# Patient Record
Sex: Female | Born: 1950 | Race: White | Hispanic: No | Marital: Married | Smoking: Never smoker
Health system: Southern US, Community
[De-identification: ages and names within clinical notes are randomized; demographics above are authoritative.]

## PROBLEM LIST (undated history)

## (undated) DIAGNOSIS — M069 Rheumatoid arthritis, unspecified: Secondary | ICD-10-CM

## (undated) HISTORY — PX: CATARACT EXTRACTION: SUR2

## (undated) HISTORY — DX: Rheumatoid arthritis, unspecified: M06.9

## (undated) HISTORY — PX: ESOPHAGOGASTRODUODENOSCOPY: SHX1529

## (undated) HISTORY — PX: COLONOSCOPY: SHX174

---

## 2013-01-10 ENCOUNTER — Other Ambulatory Visit (HOSPITAL_COMMUNITY): Payer: Self-pay | Admitting: Pulmonary Disease

## 2013-01-10 ENCOUNTER — Ambulatory Visit (HOSPITAL_COMMUNITY)
Admission: RE | Admit: 2013-01-10 | Discharge: 2013-01-10 | Disposition: A | Discharge status: Home / Self Care | Payer: BC Managed Care – PPO | Source: Ambulatory Visit | Attending: Pulmonary Disease | Admitting: Pulmonary Disease

## 2013-01-10 DIAGNOSIS — J45909 Unspecified asthma, uncomplicated: Secondary | ICD-10-CM | POA: Insufficient documentation

## 2013-01-10 DIAGNOSIS — R059 Cough, unspecified: Secondary | ICD-10-CM | POA: Insufficient documentation

## 2013-01-10 DIAGNOSIS — J4530 Mild persistent asthma, uncomplicated: Secondary | ICD-10-CM

## 2013-01-10 DIAGNOSIS — R05 Cough: Secondary | ICD-10-CM | POA: Insufficient documentation

## 2013-01-10 DIAGNOSIS — R0602 Shortness of breath: Secondary | ICD-10-CM | POA: Insufficient documentation

## 2013-11-14 ENCOUNTER — Other Ambulatory Visit (HOSPITAL_COMMUNITY): Payer: Self-pay | Admitting: Urology

## 2013-11-14 DIAGNOSIS — N390 Urinary tract infection, site not specified: Secondary | ICD-10-CM

## 2013-11-15 ENCOUNTER — Other Ambulatory Visit (HOSPITAL_COMMUNITY): Payer: Self-pay | Admitting: Urology

## 2013-11-15 DIAGNOSIS — N390 Urinary tract infection, site not specified: Secondary | ICD-10-CM

## 2013-11-16 ENCOUNTER — Ambulatory Visit (HOSPITAL_COMMUNITY)
Admission: RE | Admit: 2013-11-16 | Discharge: 2013-11-16 | Disposition: A | Discharge status: Home / Self Care | Payer: BC Managed Care – PPO | Source: Ambulatory Visit | Attending: Urology | Admitting: Urology

## 2013-11-16 ENCOUNTER — Ambulatory Visit (HOSPITAL_COMMUNITY): Payer: BC Managed Care – PPO

## 2013-11-16 DIAGNOSIS — R339 Retention of urine, unspecified: Secondary | ICD-10-CM | POA: Insufficient documentation

## 2013-11-16 DIAGNOSIS — N390 Urinary tract infection, site not specified: Secondary | ICD-10-CM | POA: Insufficient documentation

## 2015-09-17 IMAGING — US US RENAL
1 series · 14 of 25 positions shown · non-contrast
Comparison: None.

CLINICAL DATA: Recurrent urinary tract infections

EXAM:
RENAL/URINARY TRACT ULTRASOUND COMPLETE

[Series 1: us renal · 0.26mm/px · 14 of 44 slices shown]
[im 1/44]
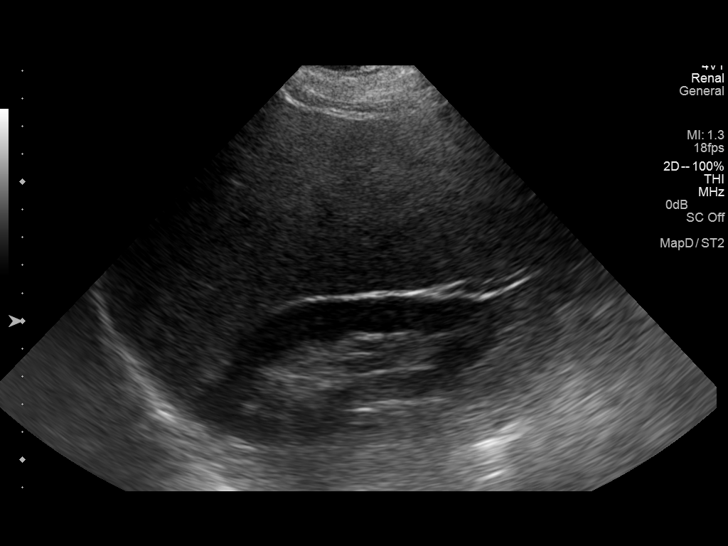
[im 4/44]
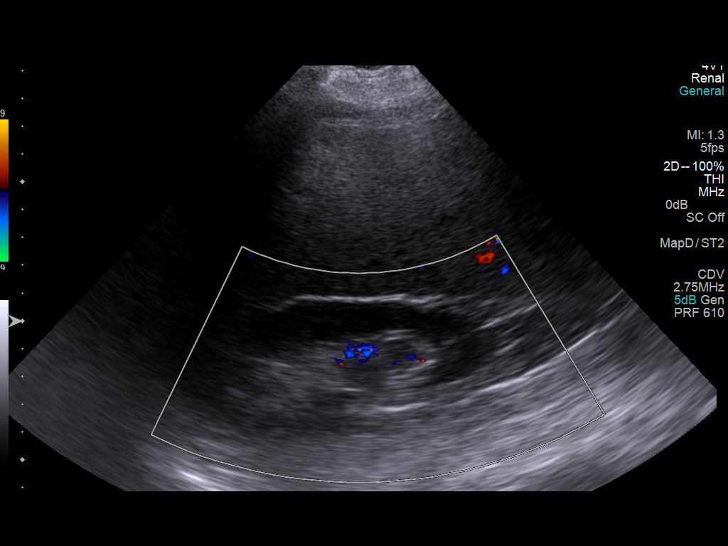
[im 8/44]
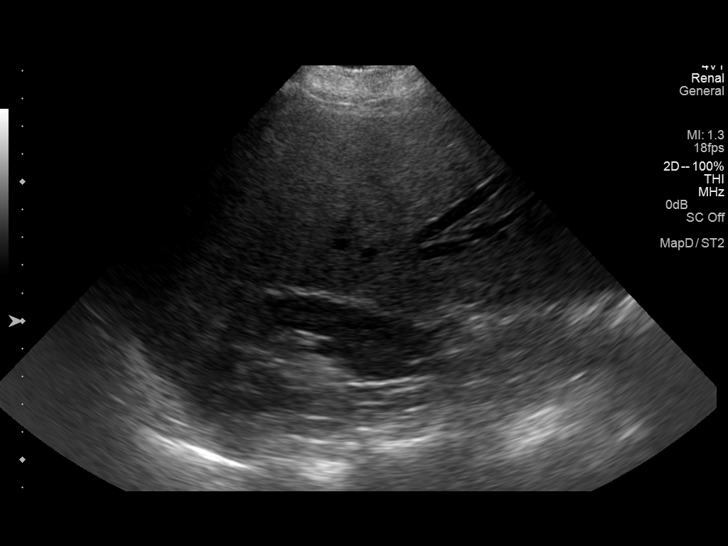
[im 11/44]
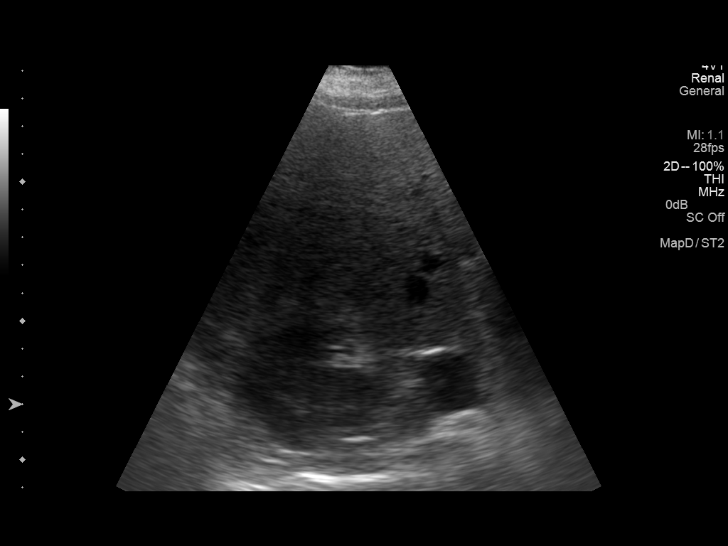
[im 15/44]
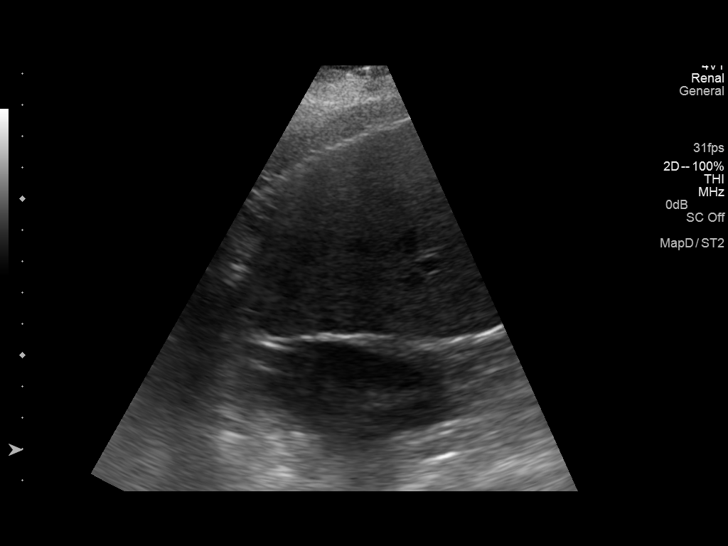
[im 17/44]
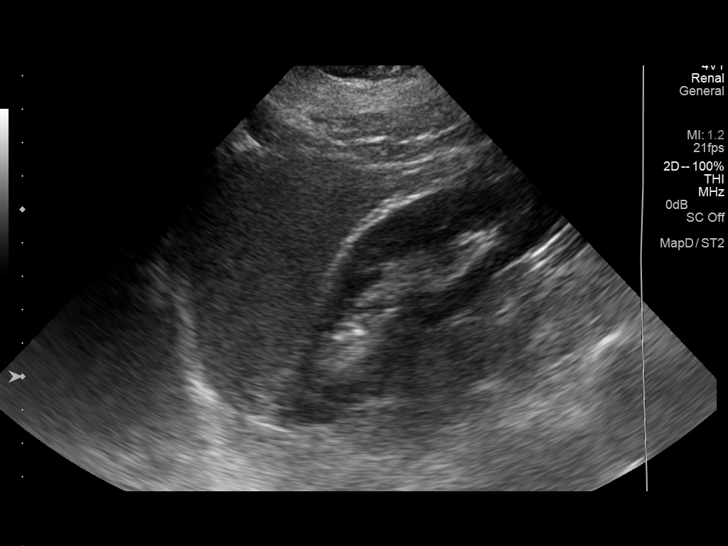
[im 20/44]
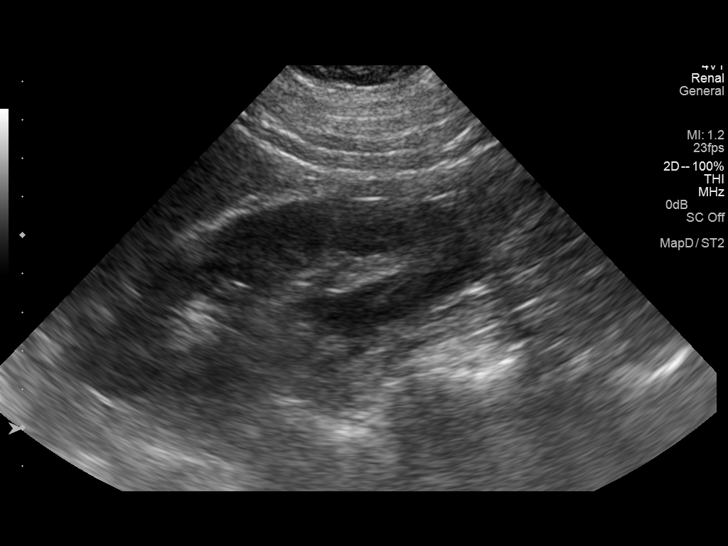
[im 24/44]
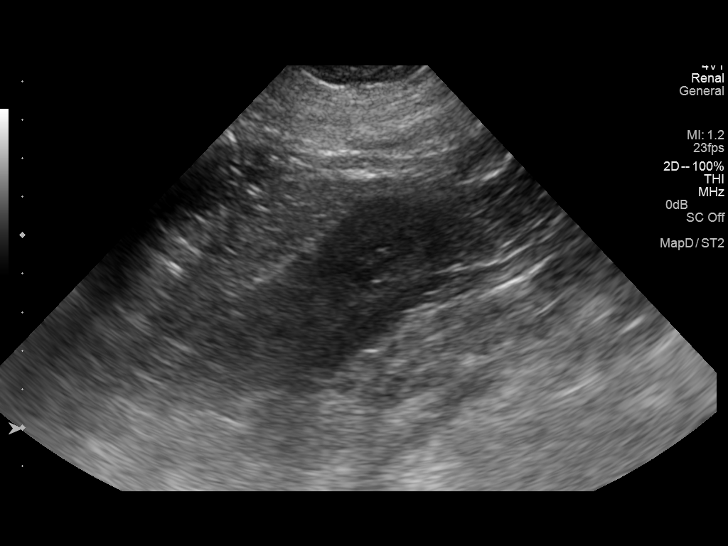
[im 27/44]
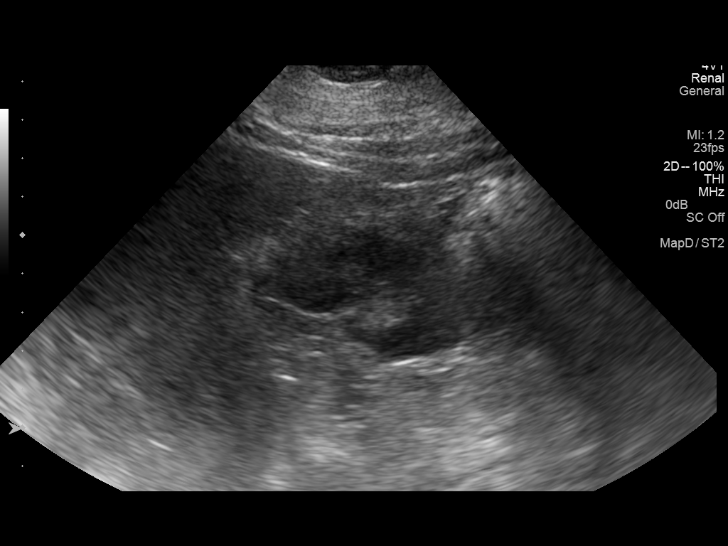
[im 29/44]
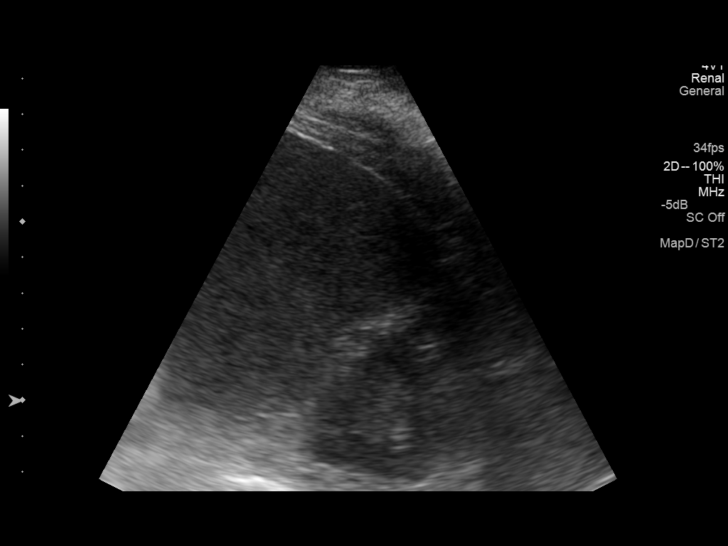
[im 33/44]
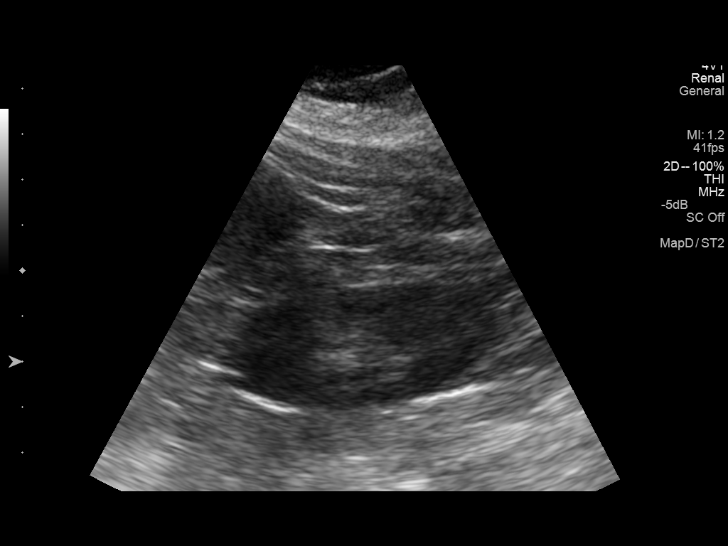
[im 36/44]
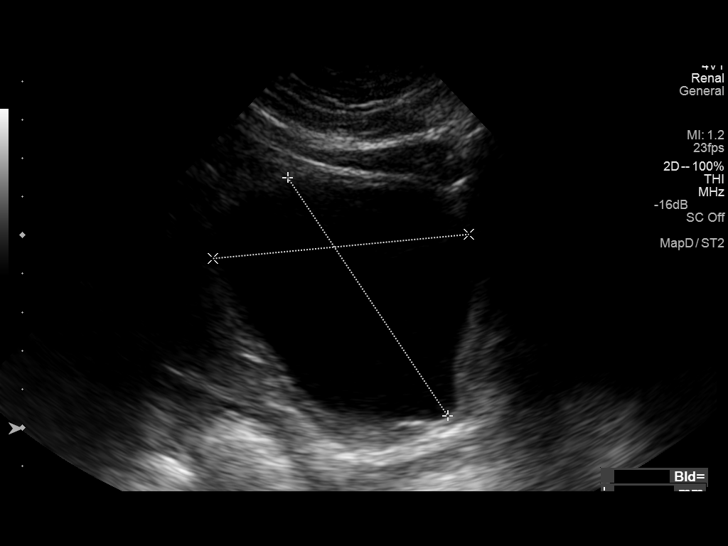
[im 40/44]
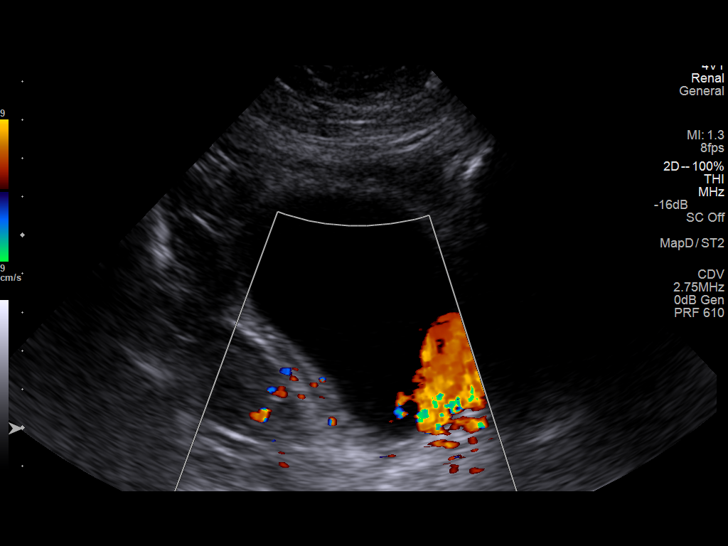
[im 44/44]
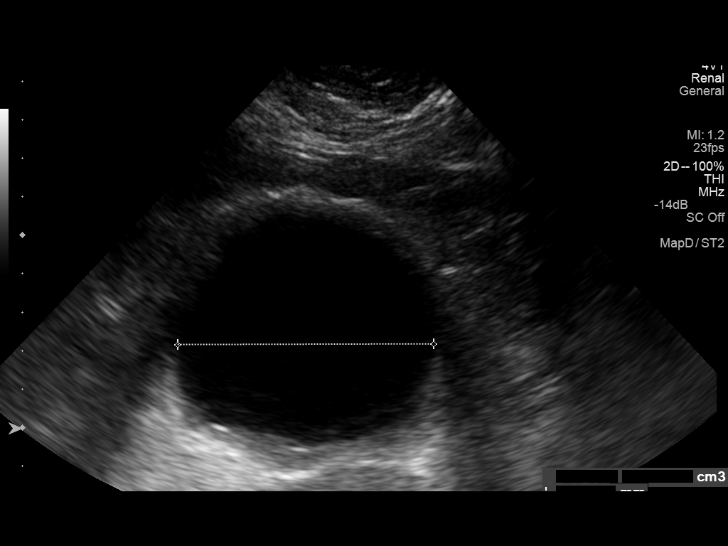

[14 of 25 positions shown; findings below may reference images not displayed]

FINDINGS: Right Kidney:

Length: 11.4 cm. Echogenicity within normal limits. No mass or
hydronephrosis visualized.

Left Kidney:

Length: 10.9 cm. Echogenicity within normal limits. No mass or
hydronephrosis visualized.

Bladder:

Bilateral ureteral jets noted. Normal appearance of the bladder. Pre
void bladder volume is equal to 270 cc. The postvoid bladder volume
is equal to 106 cc.
IMPRESSION: 1. Normal appearance of the kidneys.
2. Incomplete voiding with significant postvoid residual

## 2019-08-29 DIAGNOSIS — M3500 Sicca syndrome, unspecified: Secondary | ICD-10-CM | POA: Diagnosis not present

## 2019-08-29 DIAGNOSIS — M059 Rheumatoid arthritis with rheumatoid factor, unspecified: Secondary | ICD-10-CM | POA: Diagnosis not present

## 2019-08-29 DIAGNOSIS — Z79899 Other long term (current) drug therapy: Secondary | ICD-10-CM | POA: Diagnosis not present

## 2019-09-29 DIAGNOSIS — R7301 Impaired fasting glucose: Secondary | ICD-10-CM | POA: Diagnosis not present

## 2019-09-29 DIAGNOSIS — I10 Essential (primary) hypertension: Secondary | ICD-10-CM | POA: Diagnosis not present

## 2019-09-29 DIAGNOSIS — E559 Vitamin D deficiency, unspecified: Secondary | ICD-10-CM | POA: Diagnosis not present

## 2019-09-29 DIAGNOSIS — R944 Abnormal results of kidney function studies: Secondary | ICD-10-CM | POA: Diagnosis not present

## 2019-10-04 DIAGNOSIS — K219 Gastro-esophageal reflux disease without esophagitis: Secondary | ICD-10-CM | POA: Diagnosis not present

## 2019-10-04 DIAGNOSIS — Z0001 Encounter for general adult medical examination with abnormal findings: Secondary | ICD-10-CM | POA: Diagnosis not present

## 2019-10-04 DIAGNOSIS — J302 Other seasonal allergic rhinitis: Secondary | ICD-10-CM | POA: Diagnosis not present

## 2019-10-04 DIAGNOSIS — R7301 Impaired fasting glucose: Secondary | ICD-10-CM | POA: Diagnosis not present

## 2019-10-04 DIAGNOSIS — R7303 Prediabetes: Secondary | ICD-10-CM | POA: Diagnosis not present

## 2019-10-04 DIAGNOSIS — I129 Hypertensive chronic kidney disease with stage 1 through stage 4 chronic kidney disease, or unspecified chronic kidney disease: Secondary | ICD-10-CM | POA: Diagnosis not present

## 2019-10-04 DIAGNOSIS — E559 Vitamin D deficiency, unspecified: Secondary | ICD-10-CM | POA: Diagnosis not present

## 2019-10-04 DIAGNOSIS — M069 Rheumatoid arthritis, unspecified: Secondary | ICD-10-CM | POA: Diagnosis not present

## 2019-10-04 DIAGNOSIS — I10 Essential (primary) hypertension: Secondary | ICD-10-CM | POA: Diagnosis not present

## 2019-10-04 DIAGNOSIS — M059 Rheumatoid arthritis with rheumatoid factor, unspecified: Secondary | ICD-10-CM | POA: Diagnosis not present

## 2019-10-04 DIAGNOSIS — J4540 Moderate persistent asthma, uncomplicated: Secondary | ICD-10-CM | POA: Diagnosis not present

## 2019-10-04 DIAGNOSIS — D696 Thrombocytopenia, unspecified: Secondary | ICD-10-CM | POA: Diagnosis not present

## 2019-10-04 DIAGNOSIS — N393 Stress incontinence (female) (male): Secondary | ICD-10-CM | POA: Diagnosis not present

## 2019-11-01 DIAGNOSIS — Z01419 Encounter for gynecological examination (general) (routine) without abnormal findings: Secondary | ICD-10-CM | POA: Diagnosis not present

## 2019-11-01 DIAGNOSIS — Z1231 Encounter for screening mammogram for malignant neoplasm of breast: Secondary | ICD-10-CM | POA: Diagnosis not present

## 2019-11-03 DIAGNOSIS — G720 Drug-induced myopathy: Secondary | ICD-10-CM | POA: Diagnosis not present

## 2019-11-03 DIAGNOSIS — J45909 Unspecified asthma, uncomplicated: Secondary | ICD-10-CM | POA: Diagnosis not present

## 2019-11-03 DIAGNOSIS — J4521 Mild intermittent asthma with (acute) exacerbation: Secondary | ICD-10-CM | POA: Diagnosis not present

## 2019-11-03 DIAGNOSIS — K219 Gastro-esophageal reflux disease without esophagitis: Secondary | ICD-10-CM | POA: Diagnosis not present

## 2019-11-03 DIAGNOSIS — R944 Abnormal results of kidney function studies: Secondary | ICD-10-CM | POA: Diagnosis not present

## 2019-11-03 DIAGNOSIS — R7301 Impaired fasting glucose: Secondary | ICD-10-CM | POA: Diagnosis not present

## 2019-11-03 DIAGNOSIS — M069 Rheumatoid arthritis, unspecified: Secondary | ICD-10-CM | POA: Diagnosis not present

## 2019-11-03 DIAGNOSIS — I10 Essential (primary) hypertension: Secondary | ICD-10-CM | POA: Diagnosis not present

## 2019-11-16 DIAGNOSIS — R05 Cough: Secondary | ICD-10-CM | POA: Diagnosis not present

## 2019-11-16 DIAGNOSIS — J45909 Unspecified asthma, uncomplicated: Secondary | ICD-10-CM | POA: Diagnosis not present

## 2019-11-16 DIAGNOSIS — J301 Allergic rhinitis due to pollen: Secondary | ICD-10-CM | POA: Diagnosis not present

## 2019-11-28 DIAGNOSIS — M059 Rheumatoid arthritis with rheumatoid factor, unspecified: Secondary | ICD-10-CM | POA: Diagnosis not present

## 2019-11-28 DIAGNOSIS — Z79899 Other long term (current) drug therapy: Secondary | ICD-10-CM | POA: Diagnosis not present

## 2019-11-28 DIAGNOSIS — M3500 Sicca syndrome, unspecified: Secondary | ICD-10-CM | POA: Diagnosis not present

## 2019-12-01 DIAGNOSIS — K529 Noninfective gastroenteritis and colitis, unspecified: Secondary | ICD-10-CM | POA: Diagnosis not present

## 2020-01-11 DIAGNOSIS — J45909 Unspecified asthma, uncomplicated: Secondary | ICD-10-CM | POA: Diagnosis not present

## 2020-01-11 DIAGNOSIS — K219 Gastro-esophageal reflux disease without esophagitis: Secondary | ICD-10-CM | POA: Diagnosis not present

## 2020-01-11 DIAGNOSIS — G720 Drug-induced myopathy: Secondary | ICD-10-CM | POA: Diagnosis not present

## 2020-01-11 DIAGNOSIS — R7301 Impaired fasting glucose: Secondary | ICD-10-CM | POA: Diagnosis not present

## 2020-01-11 DIAGNOSIS — M069 Rheumatoid arthritis, unspecified: Secondary | ICD-10-CM | POA: Diagnosis not present

## 2020-01-11 DIAGNOSIS — J4521 Mild intermittent asthma with (acute) exacerbation: Secondary | ICD-10-CM | POA: Diagnosis not present

## 2020-01-11 DIAGNOSIS — R944 Abnormal results of kidney function studies: Secondary | ICD-10-CM | POA: Diagnosis not present

## 2020-01-11 DIAGNOSIS — I10 Essential (primary) hypertension: Secondary | ICD-10-CM | POA: Diagnosis not present

## 2020-01-30 DIAGNOSIS — K219 Gastro-esophageal reflux disease without esophagitis: Secondary | ICD-10-CM | POA: Diagnosis not present

## 2020-01-30 DIAGNOSIS — M069 Rheumatoid arthritis, unspecified: Secondary | ICD-10-CM | POA: Diagnosis not present

## 2020-01-30 DIAGNOSIS — G720 Drug-induced myopathy: Secondary | ICD-10-CM | POA: Diagnosis not present

## 2020-01-30 DIAGNOSIS — J45909 Unspecified asthma, uncomplicated: Secondary | ICD-10-CM | POA: Diagnosis not present

## 2020-01-30 DIAGNOSIS — J4521 Mild intermittent asthma with (acute) exacerbation: Secondary | ICD-10-CM | POA: Diagnosis not present

## 2020-01-30 DIAGNOSIS — R944 Abnormal results of kidney function studies: Secondary | ICD-10-CM | POA: Diagnosis not present

## 2020-01-30 DIAGNOSIS — I10 Essential (primary) hypertension: Secondary | ICD-10-CM | POA: Diagnosis not present

## 2020-01-30 DIAGNOSIS — R7301 Impaired fasting glucose: Secondary | ICD-10-CM | POA: Diagnosis not present

## 2020-02-22 DIAGNOSIS — M059 Rheumatoid arthritis with rheumatoid factor, unspecified: Secondary | ICD-10-CM | POA: Diagnosis not present

## 2020-02-22 DIAGNOSIS — Z79899 Other long term (current) drug therapy: Secondary | ICD-10-CM | POA: Diagnosis not present

## 2020-02-22 DIAGNOSIS — M3500 Sicca syndrome, unspecified: Secondary | ICD-10-CM | POA: Diagnosis not present

## 2020-03-12 DIAGNOSIS — R944 Abnormal results of kidney function studies: Secondary | ICD-10-CM | POA: Diagnosis not present

## 2020-03-12 DIAGNOSIS — J4521 Mild intermittent asthma with (acute) exacerbation: Secondary | ICD-10-CM | POA: Diagnosis not present

## 2020-03-12 DIAGNOSIS — R7301 Impaired fasting glucose: Secondary | ICD-10-CM | POA: Diagnosis not present

## 2020-03-12 DIAGNOSIS — I10 Essential (primary) hypertension: Secondary | ICD-10-CM | POA: Diagnosis not present

## 2020-03-12 DIAGNOSIS — K219 Gastro-esophageal reflux disease without esophagitis: Secondary | ICD-10-CM | POA: Diagnosis not present

## 2020-03-12 DIAGNOSIS — G720 Drug-induced myopathy: Secondary | ICD-10-CM | POA: Diagnosis not present

## 2020-03-12 DIAGNOSIS — M069 Rheumatoid arthritis, unspecified: Secondary | ICD-10-CM | POA: Diagnosis not present

## 2020-03-12 DIAGNOSIS — J45909 Unspecified asthma, uncomplicated: Secondary | ICD-10-CM | POA: Diagnosis not present

## 2020-04-05 DIAGNOSIS — M059 Rheumatoid arthritis with rheumatoid factor, unspecified: Secondary | ICD-10-CM | POA: Diagnosis not present

## 2020-04-05 DIAGNOSIS — Z0000 Encounter for general adult medical examination without abnormal findings: Secondary | ICD-10-CM | POA: Diagnosis not present

## 2020-04-05 DIAGNOSIS — G720 Drug-induced myopathy: Secondary | ICD-10-CM | POA: Diagnosis not present

## 2020-04-05 DIAGNOSIS — J4530 Mild persistent asthma, uncomplicated: Secondary | ICD-10-CM | POA: Diagnosis not present

## 2020-04-05 DIAGNOSIS — D696 Thrombocytopenia, unspecified: Secondary | ICD-10-CM | POA: Diagnosis not present

## 2020-04-05 DIAGNOSIS — R062 Wheezing: Secondary | ICD-10-CM | POA: Diagnosis not present

## 2020-04-05 DIAGNOSIS — J4521 Mild intermittent asthma with (acute) exacerbation: Secondary | ICD-10-CM | POA: Diagnosis not present

## 2020-04-05 DIAGNOSIS — Z0001 Encounter for general adult medical examination with abnormal findings: Secondary | ICD-10-CM | POA: Diagnosis not present

## 2020-04-05 DIAGNOSIS — E559 Vitamin D deficiency, unspecified: Secondary | ICD-10-CM | POA: Diagnosis not present

## 2020-04-06 DIAGNOSIS — I10 Essential (primary) hypertension: Secondary | ICD-10-CM | POA: Diagnosis not present

## 2020-04-06 DIAGNOSIS — R7301 Impaired fasting glucose: Secondary | ICD-10-CM | POA: Diagnosis not present

## 2020-04-06 DIAGNOSIS — M069 Rheumatoid arthritis, unspecified: Secondary | ICD-10-CM | POA: Diagnosis not present

## 2020-04-06 DIAGNOSIS — E559 Vitamin D deficiency, unspecified: Secondary | ICD-10-CM | POA: Diagnosis not present

## 2020-04-06 DIAGNOSIS — K219 Gastro-esophageal reflux disease without esophagitis: Secondary | ICD-10-CM | POA: Diagnosis not present

## 2020-04-06 DIAGNOSIS — E7849 Other hyperlipidemia: Secondary | ICD-10-CM | POA: Diagnosis not present

## 2020-04-06 DIAGNOSIS — M059 Rheumatoid arthritis with rheumatoid factor, unspecified: Secondary | ICD-10-CM | POA: Diagnosis not present

## 2020-04-10 DIAGNOSIS — M7061 Trochanteric bursitis, right hip: Secondary | ICD-10-CM | POA: Diagnosis not present

## 2020-04-10 DIAGNOSIS — J4540 Moderate persistent asthma, uncomplicated: Secondary | ICD-10-CM | POA: Diagnosis not present

## 2020-04-10 DIAGNOSIS — M5431 Sciatica, right side: Secondary | ICD-10-CM | POA: Diagnosis not present

## 2020-04-10 DIAGNOSIS — I129 Hypertensive chronic kidney disease with stage 1 through stage 4 chronic kidney disease, or unspecified chronic kidney disease: Secondary | ICD-10-CM | POA: Diagnosis not present

## 2020-04-10 DIAGNOSIS — R7303 Prediabetes: Secondary | ICD-10-CM | POA: Diagnosis not present

## 2020-04-10 DIAGNOSIS — D696 Thrombocytopenia, unspecified: Secondary | ICD-10-CM | POA: Diagnosis not present

## 2020-04-10 DIAGNOSIS — J302 Other seasonal allergic rhinitis: Secondary | ICD-10-CM | POA: Diagnosis not present

## 2020-04-10 DIAGNOSIS — M069 Rheumatoid arthritis, unspecified: Secondary | ICD-10-CM | POA: Diagnosis not present

## 2020-04-10 DIAGNOSIS — K219 Gastro-esophageal reflux disease without esophagitis: Secondary | ICD-10-CM | POA: Diagnosis not present

## 2020-04-25 DIAGNOSIS — M059 Rheumatoid arthritis with rheumatoid factor, unspecified: Secondary | ICD-10-CM | POA: Diagnosis not present

## 2020-04-25 DIAGNOSIS — Z79899 Other long term (current) drug therapy: Secondary | ICD-10-CM | POA: Diagnosis not present

## 2020-04-25 DIAGNOSIS — M3500 Sicca syndrome, unspecified: Secondary | ICD-10-CM | POA: Diagnosis not present

## 2020-04-25 DIAGNOSIS — M25551 Pain in right hip: Secondary | ICD-10-CM | POA: Diagnosis not present

## 2020-05-09 DIAGNOSIS — E7849 Other hyperlipidemia: Secondary | ICD-10-CM | POA: Diagnosis not present

## 2020-05-09 DIAGNOSIS — K219 Gastro-esophageal reflux disease without esophagitis: Secondary | ICD-10-CM | POA: Diagnosis not present

## 2020-05-09 DIAGNOSIS — E559 Vitamin D deficiency, unspecified: Secondary | ICD-10-CM | POA: Diagnosis not present

## 2020-05-09 DIAGNOSIS — M069 Rheumatoid arthritis, unspecified: Secondary | ICD-10-CM | POA: Diagnosis not present

## 2020-05-09 DIAGNOSIS — R7301 Impaired fasting glucose: Secondary | ICD-10-CM | POA: Diagnosis not present

## 2020-05-09 DIAGNOSIS — M059 Rheumatoid arthritis with rheumatoid factor, unspecified: Secondary | ICD-10-CM | POA: Diagnosis not present

## 2020-05-09 DIAGNOSIS — I10 Essential (primary) hypertension: Secondary | ICD-10-CM | POA: Diagnosis not present

## 2020-05-16 DIAGNOSIS — R059 Cough, unspecified: Secondary | ICD-10-CM | POA: Diagnosis not present

## 2020-05-16 DIAGNOSIS — J45909 Unspecified asthma, uncomplicated: Secondary | ICD-10-CM | POA: Diagnosis not present

## 2020-05-16 DIAGNOSIS — J301 Allergic rhinitis due to pollen: Secondary | ICD-10-CM | POA: Diagnosis not present

## 2020-05-18 DIAGNOSIS — G8929 Other chronic pain: Secondary | ICD-10-CM | POA: Diagnosis not present

## 2020-05-18 DIAGNOSIS — M419 Scoliosis, unspecified: Secondary | ICD-10-CM | POA: Diagnosis not present

## 2020-05-18 DIAGNOSIS — M47816 Spondylosis without myelopathy or radiculopathy, lumbar region: Secondary | ICD-10-CM | POA: Diagnosis not present

## 2020-05-18 DIAGNOSIS — M25551 Pain in right hip: Secondary | ICD-10-CM | POA: Diagnosis not present

## 2020-05-18 DIAGNOSIS — M059 Rheumatoid arthritis with rheumatoid factor, unspecified: Secondary | ICD-10-CM | POA: Diagnosis not present

## 2020-05-31 DIAGNOSIS — M059 Rheumatoid arthritis with rheumatoid factor, unspecified: Secondary | ICD-10-CM | POA: Diagnosis not present

## 2020-05-31 DIAGNOSIS — M8589 Other specified disorders of bone density and structure, multiple sites: Secondary | ICD-10-CM | POA: Diagnosis not present

## 2020-05-31 DIAGNOSIS — M3500 Sicca syndrome, unspecified: Secondary | ICD-10-CM | POA: Diagnosis not present

## 2020-06-11 DIAGNOSIS — E559 Vitamin D deficiency, unspecified: Secondary | ICD-10-CM | POA: Diagnosis not present

## 2020-06-11 DIAGNOSIS — K219 Gastro-esophageal reflux disease without esophagitis: Secondary | ICD-10-CM | POA: Diagnosis not present

## 2020-06-11 DIAGNOSIS — E7849 Other hyperlipidemia: Secondary | ICD-10-CM | POA: Diagnosis not present

## 2020-06-11 DIAGNOSIS — M069 Rheumatoid arthritis, unspecified: Secondary | ICD-10-CM | POA: Diagnosis not present

## 2020-06-11 DIAGNOSIS — I10 Essential (primary) hypertension: Secondary | ICD-10-CM | POA: Diagnosis not present

## 2020-06-11 DIAGNOSIS — M059 Rheumatoid arthritis with rheumatoid factor, unspecified: Secondary | ICD-10-CM | POA: Diagnosis not present

## 2020-06-11 DIAGNOSIS — R7301 Impaired fasting glucose: Secondary | ICD-10-CM | POA: Diagnosis not present

## 2020-07-20 DIAGNOSIS — M069 Rheumatoid arthritis, unspecified: Secondary | ICD-10-CM | POA: Diagnosis not present

## 2020-07-20 DIAGNOSIS — K219 Gastro-esophageal reflux disease without esophagitis: Secondary | ICD-10-CM | POA: Diagnosis not present

## 2020-07-20 DIAGNOSIS — I10 Essential (primary) hypertension: Secondary | ICD-10-CM | POA: Diagnosis not present

## 2020-07-20 DIAGNOSIS — M059 Rheumatoid arthritis with rheumatoid factor, unspecified: Secondary | ICD-10-CM | POA: Diagnosis not present

## 2020-07-20 DIAGNOSIS — E559 Vitamin D deficiency, unspecified: Secondary | ICD-10-CM | POA: Diagnosis not present

## 2020-07-20 DIAGNOSIS — E7849 Other hyperlipidemia: Secondary | ICD-10-CM | POA: Diagnosis not present

## 2020-07-20 DIAGNOSIS — R7301 Impaired fasting glucose: Secondary | ICD-10-CM | POA: Diagnosis not present

## 2020-08-15 DIAGNOSIS — H5203 Hypermetropia, bilateral: Secondary | ICD-10-CM | POA: Diagnosis not present

## 2020-08-15 DIAGNOSIS — H52223 Regular astigmatism, bilateral: Secondary | ICD-10-CM | POA: Diagnosis not present

## 2020-08-15 DIAGNOSIS — H524 Presbyopia: Secondary | ICD-10-CM | POA: Diagnosis not present

## 2020-08-15 DIAGNOSIS — H2513 Age-related nuclear cataract, bilateral: Secondary | ICD-10-CM | POA: Diagnosis not present

## 2020-08-15 DIAGNOSIS — H04123 Dry eye syndrome of bilateral lacrimal glands: Secondary | ICD-10-CM | POA: Diagnosis not present

## 2020-08-18 DIAGNOSIS — R7301 Impaired fasting glucose: Secondary | ICD-10-CM | POA: Diagnosis not present

## 2020-08-18 DIAGNOSIS — K219 Gastro-esophageal reflux disease without esophagitis: Secondary | ICD-10-CM | POA: Diagnosis not present

## 2020-08-18 DIAGNOSIS — M069 Rheumatoid arthritis, unspecified: Secondary | ICD-10-CM | POA: Diagnosis not present

## 2020-08-18 DIAGNOSIS — I10 Essential (primary) hypertension: Secondary | ICD-10-CM | POA: Diagnosis not present

## 2020-08-18 DIAGNOSIS — G720 Drug-induced myopathy: Secondary | ICD-10-CM | POA: Diagnosis not present

## 2020-08-18 DIAGNOSIS — R944 Abnormal results of kidney function studies: Secondary | ICD-10-CM | POA: Diagnosis not present

## 2020-08-18 DIAGNOSIS — J4521 Mild intermittent asthma with (acute) exacerbation: Secondary | ICD-10-CM | POA: Diagnosis not present

## 2020-08-18 DIAGNOSIS — J45909 Unspecified asthma, uncomplicated: Secondary | ICD-10-CM | POA: Diagnosis not present

## 2020-08-23 DIAGNOSIS — L97429 Non-pressure chronic ulcer of left heel and midfoot with unspecified severity: Secondary | ICD-10-CM | POA: Diagnosis not present

## 2020-08-29 DIAGNOSIS — M3500 Sicca syndrome, unspecified: Secondary | ICD-10-CM | POA: Diagnosis not present

## 2020-08-29 DIAGNOSIS — Z79899 Other long term (current) drug therapy: Secondary | ICD-10-CM | POA: Diagnosis not present

## 2020-08-29 DIAGNOSIS — M059 Rheumatoid arthritis with rheumatoid factor, unspecified: Secondary | ICD-10-CM | POA: Diagnosis not present

## 2020-09-17 DIAGNOSIS — R7301 Impaired fasting glucose: Secondary | ICD-10-CM | POA: Diagnosis not present

## 2020-09-17 DIAGNOSIS — R944 Abnormal results of kidney function studies: Secondary | ICD-10-CM | POA: Diagnosis not present

## 2020-09-17 DIAGNOSIS — J4521 Mild intermittent asthma with (acute) exacerbation: Secondary | ICD-10-CM | POA: Diagnosis not present

## 2020-09-17 DIAGNOSIS — J45909 Unspecified asthma, uncomplicated: Secondary | ICD-10-CM | POA: Diagnosis not present

## 2020-09-17 DIAGNOSIS — K219 Gastro-esophageal reflux disease without esophagitis: Secondary | ICD-10-CM | POA: Diagnosis not present

## 2020-09-17 DIAGNOSIS — I10 Essential (primary) hypertension: Secondary | ICD-10-CM | POA: Diagnosis not present

## 2020-09-17 DIAGNOSIS — G720 Drug-induced myopathy: Secondary | ICD-10-CM | POA: Diagnosis not present

## 2020-09-17 DIAGNOSIS — M069 Rheumatoid arthritis, unspecified: Secondary | ICD-10-CM | POA: Diagnosis not present

## 2020-09-25 DIAGNOSIS — G9341 Metabolic encephalopathy: Secondary | ICD-10-CM | POA: Diagnosis not present

## 2020-09-25 DIAGNOSIS — L03116 Cellulitis of left lower limb: Secondary | ICD-10-CM | POA: Diagnosis not present

## 2020-09-25 DIAGNOSIS — N179 Acute kidney failure, unspecified: Secondary | ICD-10-CM | POA: Diagnosis not present

## 2020-09-25 DIAGNOSIS — E876 Hypokalemia: Secondary | ICD-10-CM | POA: Diagnosis not present

## 2020-09-25 DIAGNOSIS — R410 Disorientation, unspecified: Secondary | ICD-10-CM | POA: Diagnosis not present

## 2020-09-25 DIAGNOSIS — E871 Hypo-osmolality and hyponatremia: Secondary | ICD-10-CM | POA: Diagnosis not present

## 2020-09-25 DIAGNOSIS — L97529 Non-pressure chronic ulcer of other part of left foot with unspecified severity: Secondary | ICD-10-CM | POA: Diagnosis not present

## 2020-09-25 DIAGNOSIS — N390 Urinary tract infection, site not specified: Secondary | ICD-10-CM | POA: Diagnosis not present

## 2020-09-25 DIAGNOSIS — E860 Dehydration: Secondary | ICD-10-CM | POA: Diagnosis not present

## 2020-09-25 DIAGNOSIS — A419 Sepsis, unspecified organism: Secondary | ICD-10-CM | POA: Diagnosis not present

## 2020-09-25 DIAGNOSIS — E878 Other disorders of electrolyte and fluid balance, not elsewhere classified: Secondary | ICD-10-CM | POA: Diagnosis not present

## 2020-09-25 DIAGNOSIS — R000 Tachycardia, unspecified: Secondary | ICD-10-CM | POA: Diagnosis not present

## 2020-10-08 DIAGNOSIS — I10 Essential (primary) hypertension: Secondary | ICD-10-CM | POA: Diagnosis not present

## 2020-10-08 DIAGNOSIS — Z0001 Encounter for general adult medical examination with abnormal findings: Secondary | ICD-10-CM | POA: Diagnosis not present

## 2020-10-08 DIAGNOSIS — K219 Gastro-esophageal reflux disease without esophagitis: Secondary | ICD-10-CM | POA: Diagnosis not present

## 2020-10-08 DIAGNOSIS — M5431 Sciatica, right side: Secondary | ICD-10-CM | POA: Diagnosis not present

## 2020-10-08 DIAGNOSIS — Z6841 Body Mass Index (BMI) 40.0 and over, adult: Secondary | ICD-10-CM | POA: Diagnosis not present

## 2020-10-08 DIAGNOSIS — E559 Vitamin D deficiency, unspecified: Secondary | ICD-10-CM | POA: Diagnosis not present

## 2020-10-08 DIAGNOSIS — E7849 Other hyperlipidemia: Secondary | ICD-10-CM | POA: Diagnosis not present

## 2020-10-08 DIAGNOSIS — R7303 Prediabetes: Secondary | ICD-10-CM | POA: Diagnosis not present

## 2020-10-08 DIAGNOSIS — M069 Rheumatoid arthritis, unspecified: Secondary | ICD-10-CM | POA: Diagnosis not present

## 2020-10-08 DIAGNOSIS — J4540 Moderate persistent asthma, uncomplicated: Secondary | ICD-10-CM | POA: Diagnosis not present

## 2020-10-08 DIAGNOSIS — I129 Hypertensive chronic kidney disease with stage 1 through stage 4 chronic kidney disease, or unspecified chronic kidney disease: Secondary | ICD-10-CM | POA: Diagnosis not present

## 2020-10-08 DIAGNOSIS — M7061 Trochanteric bursitis, right hip: Secondary | ICD-10-CM | POA: Diagnosis not present

## 2020-10-08 DIAGNOSIS — N182 Chronic kidney disease, stage 2 (mild): Secondary | ICD-10-CM | POA: Diagnosis not present

## 2020-10-08 DIAGNOSIS — D696 Thrombocytopenia, unspecified: Secondary | ICD-10-CM | POA: Diagnosis not present

## 2020-10-16 DIAGNOSIS — D696 Thrombocytopenia, unspecified: Secondary | ICD-10-CM | POA: Diagnosis not present

## 2020-10-16 DIAGNOSIS — J4540 Moderate persistent asthma, uncomplicated: Secondary | ICD-10-CM | POA: Diagnosis not present

## 2020-10-16 DIAGNOSIS — M069 Rheumatoid arthritis, unspecified: Secondary | ICD-10-CM | POA: Diagnosis not present

## 2020-10-16 DIAGNOSIS — M7061 Trochanteric bursitis, right hip: Secondary | ICD-10-CM | POA: Diagnosis not present

## 2020-10-16 DIAGNOSIS — J302 Other seasonal allergic rhinitis: Secondary | ICD-10-CM | POA: Diagnosis not present

## 2020-10-16 DIAGNOSIS — R7303 Prediabetes: Secondary | ICD-10-CM | POA: Diagnosis not present

## 2020-10-16 DIAGNOSIS — M5431 Sciatica, right side: Secondary | ICD-10-CM | POA: Diagnosis not present

## 2020-10-16 DIAGNOSIS — K219 Gastro-esophageal reflux disease without esophagitis: Secondary | ICD-10-CM | POA: Diagnosis not present

## 2020-10-16 DIAGNOSIS — I129 Hypertensive chronic kidney disease with stage 1 through stage 4 chronic kidney disease, or unspecified chronic kidney disease: Secondary | ICD-10-CM | POA: Diagnosis not present

## 2020-10-17 DIAGNOSIS — J4521 Mild intermittent asthma with (acute) exacerbation: Secondary | ICD-10-CM | POA: Diagnosis not present

## 2020-10-17 DIAGNOSIS — K219 Gastro-esophageal reflux disease without esophagitis: Secondary | ICD-10-CM | POA: Diagnosis not present

## 2020-10-17 DIAGNOSIS — J45909 Unspecified asthma, uncomplicated: Secondary | ICD-10-CM | POA: Diagnosis not present

## 2020-10-17 DIAGNOSIS — I10 Essential (primary) hypertension: Secondary | ICD-10-CM | POA: Diagnosis not present

## 2020-10-17 DIAGNOSIS — G720 Drug-induced myopathy: Secondary | ICD-10-CM | POA: Diagnosis not present

## 2020-10-17 DIAGNOSIS — R7301 Impaired fasting glucose: Secondary | ICD-10-CM | POA: Diagnosis not present

## 2020-10-17 DIAGNOSIS — R944 Abnormal results of kidney function studies: Secondary | ICD-10-CM | POA: Diagnosis not present

## 2020-10-17 DIAGNOSIS — M069 Rheumatoid arthritis, unspecified: Secondary | ICD-10-CM | POA: Diagnosis not present

## 2020-11-01 DIAGNOSIS — Z1231 Encounter for screening mammogram for malignant neoplasm of breast: Secondary | ICD-10-CM | POA: Diagnosis not present

## 2020-11-02 DIAGNOSIS — M1990 Unspecified osteoarthritis, unspecified site: Secondary | ICD-10-CM | POA: Diagnosis not present

## 2020-11-02 DIAGNOSIS — M21542 Acquired clubfoot, left foot: Secondary | ICD-10-CM | POA: Diagnosis not present

## 2020-11-02 DIAGNOSIS — M21541 Acquired clubfoot, right foot: Secondary | ICD-10-CM | POA: Diagnosis not present

## 2020-11-02 DIAGNOSIS — L84 Corns and callosities: Secondary | ICD-10-CM | POA: Diagnosis not present

## 2020-11-02 DIAGNOSIS — I10 Essential (primary) hypertension: Secondary | ICD-10-CM | POA: Diagnosis not present

## 2020-11-02 DIAGNOSIS — R197 Diarrhea, unspecified: Secondary | ICD-10-CM | POA: Diagnosis not present

## 2020-11-02 DIAGNOSIS — M069 Rheumatoid arthritis, unspecified: Secondary | ICD-10-CM | POA: Diagnosis not present

## 2020-11-02 DIAGNOSIS — L97528 Non-pressure chronic ulcer of other part of left foot with other specified severity: Secondary | ICD-10-CM | POA: Diagnosis not present

## 2020-11-02 DIAGNOSIS — L97429 Non-pressure chronic ulcer of left heel and midfoot with unspecified severity: Secondary | ICD-10-CM | POA: Diagnosis not present

## 2020-11-02 DIAGNOSIS — R0602 Shortness of breath: Secondary | ICD-10-CM | POA: Diagnosis not present

## 2020-11-05 DIAGNOSIS — S9400XA Injury of lateral plantar nerve, unspecified leg, initial encounter: Secondary | ICD-10-CM | POA: Diagnosis not present

## 2020-11-08 DIAGNOSIS — Z20822 Contact with and (suspected) exposure to covid-19: Secondary | ICD-10-CM | POA: Diagnosis not present

## 2020-11-12 DIAGNOSIS — B342 Coronavirus infection, unspecified: Secondary | ICD-10-CM | POA: Diagnosis not present

## 2020-11-14 DIAGNOSIS — I872 Venous insufficiency (chronic) (peripheral): Secondary | ICD-10-CM | POA: Diagnosis not present

## 2020-11-14 DIAGNOSIS — X58XXXA Exposure to other specified factors, initial encounter: Secondary | ICD-10-CM | POA: Diagnosis not present

## 2020-11-14 DIAGNOSIS — M21542 Acquired clubfoot, left foot: Secondary | ICD-10-CM | POA: Diagnosis not present

## 2020-11-14 DIAGNOSIS — M0560 Rheumatoid arthritis of unspecified site with involvement of other organs and systems: Secondary | ICD-10-CM | POA: Diagnosis not present

## 2020-11-14 DIAGNOSIS — L97528 Non-pressure chronic ulcer of other part of left foot with other specified severity: Secondary | ICD-10-CM | POA: Diagnosis not present

## 2020-11-14 DIAGNOSIS — S92352A Displaced fracture of fifth metatarsal bone, left foot, initial encounter for closed fracture: Secondary | ICD-10-CM | POA: Diagnosis not present

## 2020-11-14 DIAGNOSIS — M85872 Other specified disorders of bone density and structure, left ankle and foot: Secondary | ICD-10-CM | POA: Diagnosis not present

## 2020-11-16 DIAGNOSIS — N390 Urinary tract infection, site not specified: Secondary | ICD-10-CM | POA: Diagnosis not present

## 2020-11-16 DIAGNOSIS — M1990 Unspecified osteoarthritis, unspecified site: Secondary | ICD-10-CM | POA: Diagnosis not present

## 2020-11-16 DIAGNOSIS — M069 Rheumatoid arthritis, unspecified: Secondary | ICD-10-CM | POA: Diagnosis not present

## 2020-11-16 DIAGNOSIS — R0602 Shortness of breath: Secondary | ICD-10-CM | POA: Diagnosis not present

## 2020-11-16 DIAGNOSIS — L84 Corns and callosities: Secondary | ICD-10-CM | POA: Diagnosis not present

## 2020-11-16 DIAGNOSIS — M21541 Acquired clubfoot, right foot: Secondary | ICD-10-CM | POA: Diagnosis not present

## 2020-11-16 DIAGNOSIS — M21542 Acquired clubfoot, left foot: Secondary | ICD-10-CM | POA: Diagnosis not present

## 2020-11-16 DIAGNOSIS — L97422 Non-pressure chronic ulcer of left heel and midfoot with fat layer exposed: Secondary | ICD-10-CM | POA: Diagnosis not present

## 2020-11-16 DIAGNOSIS — I872 Venous insufficiency (chronic) (peripheral): Secondary | ICD-10-CM | POA: Diagnosis not present

## 2020-11-16 DIAGNOSIS — L97522 Non-pressure chronic ulcer of other part of left foot with fat layer exposed: Secondary | ICD-10-CM | POA: Diagnosis not present

## 2020-11-16 DIAGNOSIS — I10 Essential (primary) hypertension: Secondary | ICD-10-CM | POA: Diagnosis not present

## 2020-11-18 DIAGNOSIS — E7800 Pure hypercholesterolemia, unspecified: Secondary | ICD-10-CM | POA: Diagnosis not present

## 2020-11-18 DIAGNOSIS — K219 Gastro-esophageal reflux disease without esophagitis: Secondary | ICD-10-CM | POA: Diagnosis not present

## 2020-11-18 DIAGNOSIS — I10 Essential (primary) hypertension: Secondary | ICD-10-CM | POA: Diagnosis not present

## 2020-11-21 DIAGNOSIS — J309 Allergic rhinitis, unspecified: Secondary | ICD-10-CM | POA: Diagnosis not present

## 2020-11-21 DIAGNOSIS — J4540 Moderate persistent asthma, uncomplicated: Secondary | ICD-10-CM | POA: Diagnosis not present

## 2020-11-23 DIAGNOSIS — M1990 Unspecified osteoarthritis, unspecified site: Secondary | ICD-10-CM | POA: Diagnosis not present

## 2020-11-23 DIAGNOSIS — L97422 Non-pressure chronic ulcer of left heel and midfoot with fat layer exposed: Secondary | ICD-10-CM | POA: Diagnosis not present

## 2020-11-23 DIAGNOSIS — L84 Corns and callosities: Secondary | ICD-10-CM | POA: Diagnosis not present

## 2020-11-23 DIAGNOSIS — I872 Venous insufficiency (chronic) (peripheral): Secondary | ICD-10-CM | POA: Diagnosis not present

## 2020-11-23 DIAGNOSIS — M069 Rheumatoid arthritis, unspecified: Secondary | ICD-10-CM | POA: Diagnosis not present

## 2020-11-23 DIAGNOSIS — L97528 Non-pressure chronic ulcer of other part of left foot with other specified severity: Secondary | ICD-10-CM | POA: Diagnosis not present

## 2020-11-23 DIAGNOSIS — I739 Peripheral vascular disease, unspecified: Secondary | ICD-10-CM | POA: Diagnosis not present

## 2020-11-23 DIAGNOSIS — M21541 Acquired clubfoot, right foot: Secondary | ICD-10-CM | POA: Diagnosis not present

## 2020-11-23 DIAGNOSIS — M21542 Acquired clubfoot, left foot: Secondary | ICD-10-CM | POA: Diagnosis not present

## 2020-11-23 DIAGNOSIS — R0602 Shortness of breath: Secondary | ICD-10-CM | POA: Diagnosis not present

## 2020-11-26 DIAGNOSIS — S9400XA Injury of lateral plantar nerve, unspecified leg, initial encounter: Secondary | ICD-10-CM | POA: Diagnosis not present

## 2020-11-30 DIAGNOSIS — G6289 Other specified polyneuropathies: Secondary | ICD-10-CM | POA: Diagnosis not present

## 2020-11-30 DIAGNOSIS — I872 Venous insufficiency (chronic) (peripheral): Secondary | ICD-10-CM | POA: Diagnosis not present

## 2020-11-30 DIAGNOSIS — I739 Peripheral vascular disease, unspecified: Secondary | ICD-10-CM | POA: Diagnosis not present

## 2020-11-30 DIAGNOSIS — M1990 Unspecified osteoarthritis, unspecified site: Secondary | ICD-10-CM | POA: Diagnosis not present

## 2020-11-30 DIAGNOSIS — R0602 Shortness of breath: Secondary | ICD-10-CM | POA: Diagnosis not present

## 2020-11-30 DIAGNOSIS — M069 Rheumatoid arthritis, unspecified: Secondary | ICD-10-CM | POA: Diagnosis not present

## 2020-11-30 DIAGNOSIS — M21541 Acquired clubfoot, right foot: Secondary | ICD-10-CM | POA: Diagnosis not present

## 2020-11-30 DIAGNOSIS — L97522 Non-pressure chronic ulcer of other part of left foot with fat layer exposed: Secondary | ICD-10-CM | POA: Diagnosis not present

## 2020-11-30 DIAGNOSIS — M21542 Acquired clubfoot, left foot: Secondary | ICD-10-CM | POA: Diagnosis not present

## 2020-11-30 DIAGNOSIS — L84 Corns and callosities: Secondary | ICD-10-CM | POA: Diagnosis not present

## 2020-11-30 DIAGNOSIS — L97422 Non-pressure chronic ulcer of left heel and midfoot with fat layer exposed: Secondary | ICD-10-CM | POA: Diagnosis not present

## 2020-12-06 DIAGNOSIS — N390 Urinary tract infection, site not specified: Secondary | ICD-10-CM | POA: Diagnosis not present

## 2020-12-06 DIAGNOSIS — L750 Bromhidrosis: Secondary | ICD-10-CM | POA: Diagnosis not present

## 2020-12-06 DIAGNOSIS — R059 Cough, unspecified: Secondary | ICD-10-CM | POA: Diagnosis not present

## 2020-12-06 DIAGNOSIS — J329 Chronic sinusitis, unspecified: Secondary | ICD-10-CM | POA: Diagnosis not present

## 2020-12-07 DIAGNOSIS — M069 Rheumatoid arthritis, unspecified: Secondary | ICD-10-CM | POA: Diagnosis not present

## 2020-12-07 DIAGNOSIS — I10 Essential (primary) hypertension: Secondary | ICD-10-CM | POA: Diagnosis not present

## 2020-12-07 DIAGNOSIS — M1990 Unspecified osteoarthritis, unspecified site: Secondary | ICD-10-CM | POA: Diagnosis not present

## 2020-12-07 DIAGNOSIS — S9400XA Injury of lateral plantar nerve, unspecified leg, initial encounter: Secondary | ICD-10-CM | POA: Diagnosis not present

## 2020-12-07 DIAGNOSIS — M21542 Acquired clubfoot, left foot: Secondary | ICD-10-CM | POA: Diagnosis not present

## 2020-12-07 DIAGNOSIS — I739 Peripheral vascular disease, unspecified: Secondary | ICD-10-CM | POA: Diagnosis not present

## 2020-12-07 DIAGNOSIS — L97522 Non-pressure chronic ulcer of other part of left foot with fat layer exposed: Secondary | ICD-10-CM | POA: Diagnosis not present

## 2020-12-07 DIAGNOSIS — L84 Corns and callosities: Secondary | ICD-10-CM | POA: Diagnosis not present

## 2020-12-07 DIAGNOSIS — L97422 Non-pressure chronic ulcer of left heel and midfoot with fat layer exposed: Secondary | ICD-10-CM | POA: Diagnosis not present

## 2020-12-07 DIAGNOSIS — G6289 Other specified polyneuropathies: Secondary | ICD-10-CM | POA: Diagnosis not present

## 2020-12-07 DIAGNOSIS — I872 Venous insufficiency (chronic) (peripheral): Secondary | ICD-10-CM | POA: Diagnosis not present

## 2020-12-07 DIAGNOSIS — M21541 Acquired clubfoot, right foot: Secondary | ICD-10-CM | POA: Diagnosis not present

## 2020-12-14 DIAGNOSIS — I10 Essential (primary) hypertension: Secondary | ICD-10-CM | POA: Diagnosis not present

## 2020-12-14 DIAGNOSIS — G6289 Other specified polyneuropathies: Secondary | ICD-10-CM | POA: Diagnosis not present

## 2020-12-14 DIAGNOSIS — I739 Peripheral vascular disease, unspecified: Secondary | ICD-10-CM | POA: Diagnosis not present

## 2020-12-14 DIAGNOSIS — I872 Venous insufficiency (chronic) (peripheral): Secondary | ICD-10-CM | POA: Diagnosis not present

## 2020-12-14 DIAGNOSIS — L84 Corns and callosities: Secondary | ICD-10-CM | POA: Diagnosis not present

## 2020-12-14 DIAGNOSIS — M21541 Acquired clubfoot, right foot: Secondary | ICD-10-CM | POA: Diagnosis not present

## 2020-12-14 DIAGNOSIS — M069 Rheumatoid arthritis, unspecified: Secondary | ICD-10-CM | POA: Diagnosis not present

## 2020-12-14 DIAGNOSIS — M21542 Acquired clubfoot, left foot: Secondary | ICD-10-CM | POA: Diagnosis not present

## 2020-12-14 DIAGNOSIS — L97522 Non-pressure chronic ulcer of other part of left foot with fat layer exposed: Secondary | ICD-10-CM | POA: Diagnosis not present

## 2020-12-14 DIAGNOSIS — L97422 Non-pressure chronic ulcer of left heel and midfoot with fat layer exposed: Secondary | ICD-10-CM | POA: Diagnosis not present

## 2020-12-14 DIAGNOSIS — M1990 Unspecified osteoarthritis, unspecified site: Secondary | ICD-10-CM | POA: Diagnosis not present

## 2020-12-18 DIAGNOSIS — I10 Essential (primary) hypertension: Secondary | ICD-10-CM | POA: Diagnosis not present

## 2020-12-18 DIAGNOSIS — E7800 Pure hypercholesterolemia, unspecified: Secondary | ICD-10-CM | POA: Diagnosis not present

## 2020-12-21 DIAGNOSIS — L97422 Non-pressure chronic ulcer of left heel and midfoot with fat layer exposed: Secondary | ICD-10-CM | POA: Diagnosis not present

## 2020-12-21 DIAGNOSIS — I872 Venous insufficiency (chronic) (peripheral): Secondary | ICD-10-CM | POA: Diagnosis not present

## 2020-12-21 DIAGNOSIS — M069 Rheumatoid arthritis, unspecified: Secondary | ICD-10-CM | POA: Diagnosis not present

## 2020-12-21 DIAGNOSIS — I87312 Chronic venous hypertension (idiopathic) with ulcer of left lower extremity: Secondary | ICD-10-CM | POA: Diagnosis not present

## 2020-12-21 DIAGNOSIS — G6289 Other specified polyneuropathies: Secondary | ICD-10-CM | POA: Diagnosis not present

## 2020-12-21 DIAGNOSIS — L84 Corns and callosities: Secondary | ICD-10-CM | POA: Diagnosis not present

## 2020-12-21 DIAGNOSIS — M1990 Unspecified osteoarthritis, unspecified site: Secondary | ICD-10-CM | POA: Diagnosis not present

## 2020-12-21 DIAGNOSIS — L97522 Non-pressure chronic ulcer of other part of left foot with fat layer exposed: Secondary | ICD-10-CM | POA: Diagnosis not present

## 2020-12-21 DIAGNOSIS — M21542 Acquired clubfoot, left foot: Secondary | ICD-10-CM | POA: Diagnosis not present

## 2020-12-21 DIAGNOSIS — I739 Peripheral vascular disease, unspecified: Secondary | ICD-10-CM | POA: Diagnosis not present

## 2020-12-21 DIAGNOSIS — M21541 Acquired clubfoot, right foot: Secondary | ICD-10-CM | POA: Diagnosis not present

## 2020-12-28 DIAGNOSIS — I739 Peripheral vascular disease, unspecified: Secondary | ICD-10-CM | POA: Diagnosis not present

## 2020-12-28 DIAGNOSIS — G6289 Other specified polyneuropathies: Secondary | ICD-10-CM | POA: Diagnosis not present

## 2020-12-28 DIAGNOSIS — M1990 Unspecified osteoarthritis, unspecified site: Secondary | ICD-10-CM | POA: Diagnosis not present

## 2020-12-28 DIAGNOSIS — L84 Corns and callosities: Secondary | ICD-10-CM | POA: Diagnosis not present

## 2020-12-28 DIAGNOSIS — I872 Venous insufficiency (chronic) (peripheral): Secondary | ICD-10-CM | POA: Diagnosis not present

## 2020-12-28 DIAGNOSIS — M069 Rheumatoid arthritis, unspecified: Secondary | ICD-10-CM | POA: Diagnosis not present

## 2020-12-28 DIAGNOSIS — I87312 Chronic venous hypertension (idiopathic) with ulcer of left lower extremity: Secondary | ICD-10-CM | POA: Diagnosis not present

## 2020-12-28 DIAGNOSIS — L97422 Non-pressure chronic ulcer of left heel and midfoot with fat layer exposed: Secondary | ICD-10-CM | POA: Diagnosis not present

## 2020-12-28 DIAGNOSIS — M21542 Acquired clubfoot, left foot: Secondary | ICD-10-CM | POA: Diagnosis not present

## 2020-12-28 DIAGNOSIS — L97522 Non-pressure chronic ulcer of other part of left foot with fat layer exposed: Secondary | ICD-10-CM | POA: Diagnosis not present

## 2020-12-28 DIAGNOSIS — M21541 Acquired clubfoot, right foot: Secondary | ICD-10-CM | POA: Diagnosis not present

## 2021-01-04 DIAGNOSIS — I10 Essential (primary) hypertension: Secondary | ICD-10-CM | POA: Diagnosis not present

## 2021-01-04 DIAGNOSIS — K219 Gastro-esophageal reflux disease without esophagitis: Secondary | ICD-10-CM | POA: Diagnosis not present

## 2021-01-07 DIAGNOSIS — M069 Rheumatoid arthritis, unspecified: Secondary | ICD-10-CM | POA: Diagnosis not present

## 2021-01-07 DIAGNOSIS — L97522 Non-pressure chronic ulcer of other part of left foot with fat layer exposed: Secondary | ICD-10-CM | POA: Diagnosis not present

## 2021-01-07 DIAGNOSIS — M1990 Unspecified osteoarthritis, unspecified site: Secondary | ICD-10-CM | POA: Diagnosis not present

## 2021-01-07 DIAGNOSIS — L84 Corns and callosities: Secondary | ICD-10-CM | POA: Diagnosis not present

## 2021-01-07 DIAGNOSIS — L97422 Non-pressure chronic ulcer of left heel and midfoot with fat layer exposed: Secondary | ICD-10-CM | POA: Diagnosis not present

## 2021-01-07 DIAGNOSIS — I739 Peripheral vascular disease, unspecified: Secondary | ICD-10-CM | POA: Diagnosis not present

## 2021-01-07 DIAGNOSIS — M21541 Acquired clubfoot, right foot: Secondary | ICD-10-CM | POA: Diagnosis not present

## 2021-01-07 DIAGNOSIS — G6289 Other specified polyneuropathies: Secondary | ICD-10-CM | POA: Diagnosis not present

## 2021-01-07 DIAGNOSIS — I87312 Chronic venous hypertension (idiopathic) with ulcer of left lower extremity: Secondary | ICD-10-CM | POA: Diagnosis not present

## 2021-01-07 DIAGNOSIS — M21542 Acquired clubfoot, left foot: Secondary | ICD-10-CM | POA: Diagnosis not present

## 2021-01-07 DIAGNOSIS — I872 Venous insufficiency (chronic) (peripheral): Secondary | ICD-10-CM | POA: Diagnosis not present

## 2021-01-16 DIAGNOSIS — R0600 Dyspnea, unspecified: Secondary | ICD-10-CM | POA: Diagnosis not present

## 2021-01-16 DIAGNOSIS — I447 Left bundle-branch block, unspecified: Secondary | ICD-10-CM | POA: Diagnosis not present

## 2021-01-16 DIAGNOSIS — I872 Venous insufficiency (chronic) (peripheral): Secondary | ICD-10-CM | POA: Diagnosis not present

## 2021-01-16 DIAGNOSIS — I10 Essential (primary) hypertension: Secondary | ICD-10-CM | POA: Diagnosis not present

## 2021-01-16 DIAGNOSIS — L97529 Non-pressure chronic ulcer of other part of left foot with unspecified severity: Secondary | ICD-10-CM | POA: Diagnosis not present

## 2021-01-17 DIAGNOSIS — I10 Essential (primary) hypertension: Secondary | ICD-10-CM | POA: Diagnosis not present

## 2021-01-17 DIAGNOSIS — R059 Cough, unspecified: Secondary | ICD-10-CM | POA: Diagnosis not present

## 2021-01-17 DIAGNOSIS — J22 Unspecified acute lower respiratory infection: Secondary | ICD-10-CM | POA: Diagnosis not present

## 2021-01-17 DIAGNOSIS — E7800 Pure hypercholesterolemia, unspecified: Secondary | ICD-10-CM | POA: Diagnosis not present

## 2021-01-23 DIAGNOSIS — I872 Venous insufficiency (chronic) (peripheral): Secondary | ICD-10-CM | POA: Diagnosis not present

## 2021-01-23 DIAGNOSIS — I87312 Chronic venous hypertension (idiopathic) with ulcer of left lower extremity: Secondary | ICD-10-CM | POA: Diagnosis not present

## 2021-01-23 DIAGNOSIS — L97522 Non-pressure chronic ulcer of other part of left foot with fat layer exposed: Secondary | ICD-10-CM | POA: Diagnosis not present

## 2021-01-23 DIAGNOSIS — M069 Rheumatoid arthritis, unspecified: Secondary | ICD-10-CM | POA: Diagnosis not present

## 2021-01-23 DIAGNOSIS — L84 Corns and callosities: Secondary | ICD-10-CM | POA: Diagnosis not present

## 2021-01-23 DIAGNOSIS — G6289 Other specified polyneuropathies: Secondary | ICD-10-CM | POA: Diagnosis not present

## 2021-01-23 DIAGNOSIS — I739 Peripheral vascular disease, unspecified: Secondary | ICD-10-CM | POA: Diagnosis not present

## 2021-01-23 DIAGNOSIS — M21541 Acquired clubfoot, right foot: Secondary | ICD-10-CM | POA: Diagnosis not present

## 2021-01-23 DIAGNOSIS — L97422 Non-pressure chronic ulcer of left heel and midfoot with fat layer exposed: Secondary | ICD-10-CM | POA: Diagnosis not present

## 2021-01-23 DIAGNOSIS — M21542 Acquired clubfoot, left foot: Secondary | ICD-10-CM | POA: Diagnosis not present

## 2021-01-23 DIAGNOSIS — M1990 Unspecified osteoarthritis, unspecified site: Secondary | ICD-10-CM | POA: Diagnosis not present

## 2021-01-25 DIAGNOSIS — M069 Rheumatoid arthritis, unspecified: Secondary | ICD-10-CM | POA: Diagnosis not present

## 2021-01-25 DIAGNOSIS — J45909 Unspecified asthma, uncomplicated: Secondary | ICD-10-CM | POA: Diagnosis not present

## 2021-01-25 DIAGNOSIS — L97929 Non-pressure chronic ulcer of unspecified part of left lower leg with unspecified severity: Secondary | ICD-10-CM | POA: Diagnosis not present

## 2021-01-25 DIAGNOSIS — M1990 Unspecified osteoarthritis, unspecified site: Secondary | ICD-10-CM | POA: Diagnosis not present

## 2021-01-25 DIAGNOSIS — Z6837 Body mass index (BMI) 37.0-37.9, adult: Secondary | ICD-10-CM | POA: Diagnosis not present

## 2021-01-25 DIAGNOSIS — I10 Essential (primary) hypertension: Secondary | ICD-10-CM | POA: Diagnosis not present

## 2021-01-25 DIAGNOSIS — I447 Left bundle-branch block, unspecified: Secondary | ICD-10-CM | POA: Diagnosis not present

## 2021-01-25 DIAGNOSIS — L97521 Non-pressure chronic ulcer of other part of left foot limited to breakdown of skin: Secondary | ICD-10-CM | POA: Diagnosis not present

## 2021-01-25 DIAGNOSIS — M3500 Sicca syndrome, unspecified: Secondary | ICD-10-CM | POA: Diagnosis not present

## 2021-01-30 DIAGNOSIS — L84 Corns and callosities: Secondary | ICD-10-CM | POA: Diagnosis not present

## 2021-01-30 DIAGNOSIS — M069 Rheumatoid arthritis, unspecified: Secondary | ICD-10-CM | POA: Diagnosis not present

## 2021-01-30 DIAGNOSIS — L97422 Non-pressure chronic ulcer of left heel and midfoot with fat layer exposed: Secondary | ICD-10-CM | POA: Diagnosis not present

## 2021-01-30 DIAGNOSIS — M21541 Acquired clubfoot, right foot: Secondary | ICD-10-CM | POA: Diagnosis not present

## 2021-01-30 DIAGNOSIS — L97522 Non-pressure chronic ulcer of other part of left foot with fat layer exposed: Secondary | ICD-10-CM | POA: Diagnosis not present

## 2021-01-30 DIAGNOSIS — I739 Peripheral vascular disease, unspecified: Secondary | ICD-10-CM | POA: Diagnosis not present

## 2021-01-30 DIAGNOSIS — G6289 Other specified polyneuropathies: Secondary | ICD-10-CM | POA: Diagnosis not present

## 2021-01-30 DIAGNOSIS — M1990 Unspecified osteoarthritis, unspecified site: Secondary | ICD-10-CM | POA: Diagnosis not present

## 2021-01-30 DIAGNOSIS — I87313 Chronic venous hypertension (idiopathic) with ulcer of bilateral lower extremity: Secondary | ICD-10-CM | POA: Diagnosis not present

## 2021-01-30 DIAGNOSIS — M21542 Acquired clubfoot, left foot: Secondary | ICD-10-CM | POA: Diagnosis not present

## 2021-01-30 DIAGNOSIS — I872 Venous insufficiency (chronic) (peripheral): Secondary | ICD-10-CM | POA: Diagnosis not present

## 2021-02-05 DIAGNOSIS — I872 Venous insufficiency (chronic) (peripheral): Secondary | ICD-10-CM | POA: Diagnosis not present

## 2021-02-05 DIAGNOSIS — Z79899 Other long term (current) drug therapy: Secondary | ICD-10-CM | POA: Diagnosis not present

## 2021-02-05 DIAGNOSIS — L97529 Non-pressure chronic ulcer of other part of left foot with unspecified severity: Secondary | ICD-10-CM | POA: Diagnosis not present

## 2021-02-05 DIAGNOSIS — M3500 Sicca syndrome, unspecified: Secondary | ICD-10-CM | POA: Diagnosis not present

## 2021-02-05 DIAGNOSIS — I10 Essential (primary) hypertension: Secondary | ICD-10-CM | POA: Diagnosis not present

## 2021-02-05 DIAGNOSIS — I447 Left bundle-branch block, unspecified: Secondary | ICD-10-CM | POA: Diagnosis not present

## 2021-02-05 DIAGNOSIS — Q6689 Other  specified congenital deformities of feet: Secondary | ICD-10-CM | POA: Diagnosis not present

## 2021-02-05 DIAGNOSIS — M069 Rheumatoid arthritis, unspecified: Secondary | ICD-10-CM | POA: Diagnosis not present

## 2021-02-05 DIAGNOSIS — Z881 Allergy status to other antibiotic agents status: Secondary | ICD-10-CM | POA: Diagnosis not present

## 2021-02-05 DIAGNOSIS — I83892 Varicose veins of left lower extremities with other complications: Secondary | ICD-10-CM | POA: Diagnosis not present

## 2021-02-06 DIAGNOSIS — L97422 Non-pressure chronic ulcer of left heel and midfoot with fat layer exposed: Secondary | ICD-10-CM | POA: Diagnosis not present

## 2021-02-06 DIAGNOSIS — M21541 Acquired clubfoot, right foot: Secondary | ICD-10-CM | POA: Diagnosis not present

## 2021-02-06 DIAGNOSIS — I739 Peripheral vascular disease, unspecified: Secondary | ICD-10-CM | POA: Diagnosis not present

## 2021-02-06 DIAGNOSIS — M21542 Acquired clubfoot, left foot: Secondary | ICD-10-CM | POA: Diagnosis not present

## 2021-02-06 DIAGNOSIS — I872 Venous insufficiency (chronic) (peripheral): Secondary | ICD-10-CM | POA: Diagnosis not present

## 2021-02-06 DIAGNOSIS — I87312 Chronic venous hypertension (idiopathic) with ulcer of left lower extremity: Secondary | ICD-10-CM | POA: Diagnosis not present

## 2021-02-06 DIAGNOSIS — L97522 Non-pressure chronic ulcer of other part of left foot with fat layer exposed: Secondary | ICD-10-CM | POA: Diagnosis not present

## 2021-02-06 DIAGNOSIS — M069 Rheumatoid arthritis, unspecified: Secondary | ICD-10-CM | POA: Diagnosis not present

## 2021-02-06 DIAGNOSIS — M1990 Unspecified osteoarthritis, unspecified site: Secondary | ICD-10-CM | POA: Diagnosis not present

## 2021-02-06 DIAGNOSIS — L84 Corns and callosities: Secondary | ICD-10-CM | POA: Diagnosis not present

## 2021-02-06 DIAGNOSIS — G6289 Other specified polyneuropathies: Secondary | ICD-10-CM | POA: Diagnosis not present

## 2021-02-08 DIAGNOSIS — I872 Venous insufficiency (chronic) (peripheral): Secondary | ICD-10-CM | POA: Diagnosis not present

## 2021-02-08 DIAGNOSIS — Z9889 Other specified postprocedural states: Secondary | ICD-10-CM | POA: Diagnosis not present

## 2021-02-13 DIAGNOSIS — S9400XA Injury of lateral plantar nerve, unspecified leg, initial encounter: Secondary | ICD-10-CM | POA: Diagnosis not present

## 2021-02-13 DIAGNOSIS — I739 Peripheral vascular disease, unspecified: Secondary | ICD-10-CM | POA: Diagnosis not present

## 2021-02-13 DIAGNOSIS — L97522 Non-pressure chronic ulcer of other part of left foot with fat layer exposed: Secondary | ICD-10-CM | POA: Diagnosis not present

## 2021-02-13 DIAGNOSIS — L84 Corns and callosities: Secondary | ICD-10-CM | POA: Diagnosis not present

## 2021-02-13 DIAGNOSIS — G6289 Other specified polyneuropathies: Secondary | ICD-10-CM | POA: Diagnosis not present

## 2021-02-13 DIAGNOSIS — I872 Venous insufficiency (chronic) (peripheral): Secondary | ICD-10-CM | POA: Diagnosis not present

## 2021-02-13 DIAGNOSIS — M1990 Unspecified osteoarthritis, unspecified site: Secondary | ICD-10-CM | POA: Diagnosis not present

## 2021-02-13 DIAGNOSIS — L97422 Non-pressure chronic ulcer of left heel and midfoot with fat layer exposed: Secondary | ICD-10-CM | POA: Diagnosis not present

## 2021-02-13 DIAGNOSIS — M21542 Acquired clubfoot, left foot: Secondary | ICD-10-CM | POA: Diagnosis not present

## 2021-02-13 DIAGNOSIS — M069 Rheumatoid arthritis, unspecified: Secondary | ICD-10-CM | POA: Diagnosis not present

## 2021-02-13 DIAGNOSIS — M21541 Acquired clubfoot, right foot: Secondary | ICD-10-CM | POA: Diagnosis not present

## 2021-02-13 DIAGNOSIS — I87312 Chronic venous hypertension (idiopathic) with ulcer of left lower extremity: Secondary | ICD-10-CM | POA: Diagnosis not present

## 2021-02-17 DIAGNOSIS — K219 Gastro-esophageal reflux disease without esophagitis: Secondary | ICD-10-CM | POA: Diagnosis not present

## 2021-02-17 DIAGNOSIS — E7800 Pure hypercholesterolemia, unspecified: Secondary | ICD-10-CM | POA: Diagnosis not present

## 2021-02-17 DIAGNOSIS — I10 Essential (primary) hypertension: Secondary | ICD-10-CM | POA: Diagnosis not present

## 2021-02-19 DIAGNOSIS — I10 Essential (primary) hypertension: Secondary | ICD-10-CM | POA: Diagnosis not present

## 2021-02-19 DIAGNOSIS — Q6689 Other  specified congenital deformities of feet: Secondary | ICD-10-CM | POA: Diagnosis not present

## 2021-02-19 DIAGNOSIS — I83891 Varicose veins of right lower extremities with other complications: Secondary | ICD-10-CM | POA: Diagnosis not present

## 2021-02-19 DIAGNOSIS — Z79899 Other long term (current) drug therapy: Secondary | ICD-10-CM | POA: Diagnosis not present

## 2021-02-19 DIAGNOSIS — L97529 Non-pressure chronic ulcer of other part of left foot with unspecified severity: Secondary | ICD-10-CM | POA: Diagnosis not present

## 2021-02-19 DIAGNOSIS — J45909 Unspecified asthma, uncomplicated: Secondary | ICD-10-CM | POA: Diagnosis not present

## 2021-02-19 DIAGNOSIS — M069 Rheumatoid arthritis, unspecified: Secondary | ICD-10-CM | POA: Diagnosis not present

## 2021-02-19 DIAGNOSIS — I872 Venous insufficiency (chronic) (peripheral): Secondary | ICD-10-CM | POA: Diagnosis not present

## 2021-02-19 DIAGNOSIS — M3500 Sicca syndrome, unspecified: Secondary | ICD-10-CM | POA: Diagnosis not present

## 2021-02-19 DIAGNOSIS — I447 Left bundle-branch block, unspecified: Secondary | ICD-10-CM | POA: Diagnosis not present

## 2021-02-20 DIAGNOSIS — I872 Venous insufficiency (chronic) (peripheral): Secondary | ICD-10-CM | POA: Diagnosis not present

## 2021-02-20 DIAGNOSIS — M1990 Unspecified osteoarthritis, unspecified site: Secondary | ICD-10-CM | POA: Diagnosis not present

## 2021-02-20 DIAGNOSIS — I87312 Chronic venous hypertension (idiopathic) with ulcer of left lower extremity: Secondary | ICD-10-CM | POA: Diagnosis not present

## 2021-02-20 DIAGNOSIS — M21542 Acquired clubfoot, left foot: Secondary | ICD-10-CM | POA: Diagnosis not present

## 2021-02-20 DIAGNOSIS — L97522 Non-pressure chronic ulcer of other part of left foot with fat layer exposed: Secondary | ICD-10-CM | POA: Diagnosis not present

## 2021-02-20 DIAGNOSIS — L84 Corns and callosities: Secondary | ICD-10-CM | POA: Diagnosis not present

## 2021-02-20 DIAGNOSIS — I739 Peripheral vascular disease, unspecified: Secondary | ICD-10-CM | POA: Diagnosis not present

## 2021-02-20 DIAGNOSIS — M21541 Acquired clubfoot, right foot: Secondary | ICD-10-CM | POA: Diagnosis not present

## 2021-02-20 DIAGNOSIS — M069 Rheumatoid arthritis, unspecified: Secondary | ICD-10-CM | POA: Diagnosis not present

## 2021-02-20 DIAGNOSIS — G6289 Other specified polyneuropathies: Secondary | ICD-10-CM | POA: Diagnosis not present

## 2021-02-20 DIAGNOSIS — L97422 Non-pressure chronic ulcer of left heel and midfoot with fat layer exposed: Secondary | ICD-10-CM | POA: Diagnosis not present

## 2021-02-22 DIAGNOSIS — Z9889 Other specified postprocedural states: Secondary | ICD-10-CM | POA: Diagnosis not present

## 2021-02-22 DIAGNOSIS — I872 Venous insufficiency (chronic) (peripheral): Secondary | ICD-10-CM | POA: Diagnosis not present

## 2021-02-27 DIAGNOSIS — M069 Rheumatoid arthritis, unspecified: Secondary | ICD-10-CM | POA: Diagnosis not present

## 2021-02-27 DIAGNOSIS — L97522 Non-pressure chronic ulcer of other part of left foot with fat layer exposed: Secondary | ICD-10-CM | POA: Diagnosis not present

## 2021-02-27 DIAGNOSIS — M21542 Acquired clubfoot, left foot: Secondary | ICD-10-CM | POA: Diagnosis not present

## 2021-02-27 DIAGNOSIS — I872 Venous insufficiency (chronic) (peripheral): Secondary | ICD-10-CM | POA: Diagnosis not present

## 2021-02-27 DIAGNOSIS — M21541 Acquired clubfoot, right foot: Secondary | ICD-10-CM | POA: Diagnosis not present

## 2021-02-27 DIAGNOSIS — G6289 Other specified polyneuropathies: Secondary | ICD-10-CM | POA: Diagnosis not present

## 2021-02-27 DIAGNOSIS — L97422 Non-pressure chronic ulcer of left heel and midfoot with fat layer exposed: Secondary | ICD-10-CM | POA: Diagnosis not present

## 2021-02-27 DIAGNOSIS — L84 Corns and callosities: Secondary | ICD-10-CM | POA: Diagnosis not present

## 2021-02-27 DIAGNOSIS — I739 Peripheral vascular disease, unspecified: Secondary | ICD-10-CM | POA: Diagnosis not present

## 2021-02-27 DIAGNOSIS — I87312 Chronic venous hypertension (idiopathic) with ulcer of left lower extremity: Secondary | ICD-10-CM | POA: Diagnosis not present

## 2021-02-27 DIAGNOSIS — M1990 Unspecified osteoarthritis, unspecified site: Secondary | ICD-10-CM | POA: Diagnosis not present

## 2021-03-06 DIAGNOSIS — L84 Corns and callosities: Secondary | ICD-10-CM | POA: Diagnosis not present

## 2021-03-06 DIAGNOSIS — S9400XA Injury of lateral plantar nerve, unspecified leg, initial encounter: Secondary | ICD-10-CM | POA: Diagnosis not present

## 2021-03-06 DIAGNOSIS — M069 Rheumatoid arthritis, unspecified: Secondary | ICD-10-CM | POA: Diagnosis not present

## 2021-03-06 DIAGNOSIS — M1990 Unspecified osteoarthritis, unspecified site: Secondary | ICD-10-CM | POA: Diagnosis not present

## 2021-03-06 DIAGNOSIS — G6289 Other specified polyneuropathies: Secondary | ICD-10-CM | POA: Diagnosis not present

## 2021-03-06 DIAGNOSIS — I739 Peripheral vascular disease, unspecified: Secondary | ICD-10-CM | POA: Diagnosis not present

## 2021-03-06 DIAGNOSIS — I872 Venous insufficiency (chronic) (peripheral): Secondary | ICD-10-CM | POA: Diagnosis not present

## 2021-03-06 DIAGNOSIS — L97522 Non-pressure chronic ulcer of other part of left foot with fat layer exposed: Secondary | ICD-10-CM | POA: Diagnosis not present

## 2021-03-06 DIAGNOSIS — L97422 Non-pressure chronic ulcer of left heel and midfoot with fat layer exposed: Secondary | ICD-10-CM | POA: Diagnosis not present

## 2021-03-06 DIAGNOSIS — I87312 Chronic venous hypertension (idiopathic) with ulcer of left lower extremity: Secondary | ICD-10-CM | POA: Diagnosis not present

## 2021-03-06 DIAGNOSIS — M21542 Acquired clubfoot, left foot: Secondary | ICD-10-CM | POA: Diagnosis not present

## 2021-03-06 DIAGNOSIS — M21541 Acquired clubfoot, right foot: Secondary | ICD-10-CM | POA: Diagnosis not present

## 2021-03-07 DIAGNOSIS — M069 Rheumatoid arthritis, unspecified: Secondary | ICD-10-CM | POA: Diagnosis not present

## 2021-03-07 DIAGNOSIS — M1990 Unspecified osteoarthritis, unspecified site: Secondary | ICD-10-CM | POA: Diagnosis not present

## 2021-03-07 DIAGNOSIS — I83892 Varicose veins of left lower extremities with other complications: Secondary | ICD-10-CM | POA: Diagnosis not present

## 2021-03-07 DIAGNOSIS — I10 Essential (primary) hypertension: Secondary | ICD-10-CM | POA: Diagnosis not present

## 2021-03-07 DIAGNOSIS — Z79899 Other long term (current) drug therapy: Secondary | ICD-10-CM | POA: Diagnosis not present

## 2021-03-07 DIAGNOSIS — I872 Venous insufficiency (chronic) (peripheral): Secondary | ICD-10-CM | POA: Diagnosis not present

## 2021-03-07 DIAGNOSIS — L97529 Non-pressure chronic ulcer of other part of left foot with unspecified severity: Secondary | ICD-10-CM | POA: Diagnosis not present

## 2021-03-07 DIAGNOSIS — M3500 Sicca syndrome, unspecified: Secondary | ICD-10-CM | POA: Diagnosis not present

## 2021-03-07 DIAGNOSIS — Z881 Allergy status to other antibiotic agents status: Secondary | ICD-10-CM | POA: Diagnosis not present

## 2021-03-07 DIAGNOSIS — I447 Left bundle-branch block, unspecified: Secondary | ICD-10-CM | POA: Diagnosis not present

## 2021-03-12 DIAGNOSIS — I872 Venous insufficiency (chronic) (peripheral): Secondary | ICD-10-CM | POA: Diagnosis not present

## 2021-03-12 DIAGNOSIS — Z9889 Other specified postprocedural states: Secondary | ICD-10-CM | POA: Diagnosis not present

## 2021-03-13 DIAGNOSIS — M21542 Acquired clubfoot, left foot: Secondary | ICD-10-CM | POA: Diagnosis not present

## 2021-03-13 DIAGNOSIS — I87312 Chronic venous hypertension (idiopathic) with ulcer of left lower extremity: Secondary | ICD-10-CM | POA: Diagnosis not present

## 2021-03-13 DIAGNOSIS — M1990 Unspecified osteoarthritis, unspecified site: Secondary | ICD-10-CM | POA: Diagnosis not present

## 2021-03-13 DIAGNOSIS — M069 Rheumatoid arthritis, unspecified: Secondary | ICD-10-CM | POA: Diagnosis not present

## 2021-03-13 DIAGNOSIS — M21541 Acquired clubfoot, right foot: Secondary | ICD-10-CM | POA: Diagnosis not present

## 2021-03-13 DIAGNOSIS — L97422 Non-pressure chronic ulcer of left heel and midfoot with fat layer exposed: Secondary | ICD-10-CM | POA: Diagnosis not present

## 2021-03-13 DIAGNOSIS — I739 Peripheral vascular disease, unspecified: Secondary | ICD-10-CM | POA: Diagnosis not present

## 2021-03-13 DIAGNOSIS — G6289 Other specified polyneuropathies: Secondary | ICD-10-CM | POA: Diagnosis not present

## 2021-03-13 DIAGNOSIS — L84 Corns and callosities: Secondary | ICD-10-CM | POA: Diagnosis not present

## 2021-03-13 DIAGNOSIS — L97522 Non-pressure chronic ulcer of other part of left foot with fat layer exposed: Secondary | ICD-10-CM | POA: Diagnosis not present

## 2021-03-13 DIAGNOSIS — I872 Venous insufficiency (chronic) (peripheral): Secondary | ICD-10-CM | POA: Diagnosis not present

## 2021-03-20 DIAGNOSIS — E119 Type 2 diabetes mellitus without complications: Secondary | ICD-10-CM | POA: Diagnosis not present

## 2021-03-20 DIAGNOSIS — K219 Gastro-esophageal reflux disease without esophagitis: Secondary | ICD-10-CM | POA: Diagnosis not present

## 2021-03-20 DIAGNOSIS — I10 Essential (primary) hypertension: Secondary | ICD-10-CM | POA: Diagnosis not present

## 2021-03-28 DIAGNOSIS — I739 Peripheral vascular disease, unspecified: Secondary | ICD-10-CM | POA: Diagnosis not present

## 2021-03-28 DIAGNOSIS — L97422 Non-pressure chronic ulcer of left heel and midfoot with fat layer exposed: Secondary | ICD-10-CM | POA: Diagnosis not present

## 2021-03-28 DIAGNOSIS — L84 Corns and callosities: Secondary | ICD-10-CM | POA: Diagnosis not present

## 2021-03-28 DIAGNOSIS — I87312 Chronic venous hypertension (idiopathic) with ulcer of left lower extremity: Secondary | ICD-10-CM | POA: Diagnosis not present

## 2021-03-28 DIAGNOSIS — M21542 Acquired clubfoot, left foot: Secondary | ICD-10-CM | POA: Diagnosis not present

## 2021-03-28 DIAGNOSIS — M069 Rheumatoid arthritis, unspecified: Secondary | ICD-10-CM | POA: Diagnosis not present

## 2021-03-28 DIAGNOSIS — L97522 Non-pressure chronic ulcer of other part of left foot with fat layer exposed: Secondary | ICD-10-CM | POA: Diagnosis not present

## 2021-03-28 DIAGNOSIS — M1990 Unspecified osteoarthritis, unspecified site: Secondary | ICD-10-CM | POA: Diagnosis not present

## 2021-03-28 DIAGNOSIS — G6289 Other specified polyneuropathies: Secondary | ICD-10-CM | POA: Diagnosis not present

## 2021-03-28 DIAGNOSIS — I872 Venous insufficiency (chronic) (peripheral): Secondary | ICD-10-CM | POA: Diagnosis not present

## 2021-03-28 DIAGNOSIS — M21541 Acquired clubfoot, right foot: Secondary | ICD-10-CM | POA: Diagnosis not present

## 2021-04-01 DIAGNOSIS — M19042 Primary osteoarthritis, left hand: Secondary | ICD-10-CM | POA: Diagnosis not present

## 2021-04-01 DIAGNOSIS — M19041 Primary osteoarthritis, right hand: Secondary | ICD-10-CM | POA: Diagnosis not present

## 2021-04-01 DIAGNOSIS — M8580 Other specified disorders of bone density and structure, unspecified site: Secondary | ICD-10-CM | POA: Diagnosis not present

## 2021-04-01 DIAGNOSIS — M3500 Sicca syndrome, unspecified: Secondary | ICD-10-CM | POA: Diagnosis not present

## 2021-04-01 DIAGNOSIS — M059 Rheumatoid arthritis with rheumatoid factor, unspecified: Secondary | ICD-10-CM | POA: Diagnosis not present

## 2021-04-01 DIAGNOSIS — Z23 Encounter for immunization: Secondary | ICD-10-CM | POA: Diagnosis not present

## 2021-04-01 DIAGNOSIS — Z79899 Other long term (current) drug therapy: Secondary | ICD-10-CM | POA: Diagnosis not present

## 2021-04-01 DIAGNOSIS — M170 Bilateral primary osteoarthritis of knee: Secondary | ICD-10-CM | POA: Diagnosis not present

## 2021-04-11 DIAGNOSIS — L97522 Non-pressure chronic ulcer of other part of left foot with fat layer exposed: Secondary | ICD-10-CM | POA: Diagnosis not present

## 2021-04-11 DIAGNOSIS — G6289 Other specified polyneuropathies: Secondary | ICD-10-CM | POA: Diagnosis not present

## 2021-04-11 DIAGNOSIS — M21541 Acquired clubfoot, right foot: Secondary | ICD-10-CM | POA: Diagnosis not present

## 2021-04-11 DIAGNOSIS — I872 Venous insufficiency (chronic) (peripheral): Secondary | ICD-10-CM | POA: Diagnosis not present

## 2021-04-11 DIAGNOSIS — L84 Corns and callosities: Secondary | ICD-10-CM | POA: Diagnosis not present

## 2021-04-11 DIAGNOSIS — I739 Peripheral vascular disease, unspecified: Secondary | ICD-10-CM | POA: Diagnosis not present

## 2021-04-11 DIAGNOSIS — M1990 Unspecified osteoarthritis, unspecified site: Secondary | ICD-10-CM | POA: Diagnosis not present

## 2021-04-11 DIAGNOSIS — L97422 Non-pressure chronic ulcer of left heel and midfoot with fat layer exposed: Secondary | ICD-10-CM | POA: Diagnosis not present

## 2021-04-11 DIAGNOSIS — M21542 Acquired clubfoot, left foot: Secondary | ICD-10-CM | POA: Diagnosis not present

## 2021-04-11 DIAGNOSIS — I87312 Chronic venous hypertension (idiopathic) with ulcer of left lower extremity: Secondary | ICD-10-CM | POA: Diagnosis not present

## 2021-04-11 DIAGNOSIS — M069 Rheumatoid arthritis, unspecified: Secondary | ICD-10-CM | POA: Diagnosis not present

## 2021-04-15 DIAGNOSIS — E559 Vitamin D deficiency, unspecified: Secondary | ICD-10-CM | POA: Diagnosis not present

## 2021-04-15 DIAGNOSIS — E785 Hyperlipidemia, unspecified: Secondary | ICD-10-CM | POA: Diagnosis not present

## 2021-04-15 DIAGNOSIS — R7303 Prediabetes: Secondary | ICD-10-CM | POA: Diagnosis not present

## 2021-04-17 DIAGNOSIS — I129 Hypertensive chronic kidney disease with stage 1 through stage 4 chronic kidney disease, or unspecified chronic kidney disease: Secondary | ICD-10-CM | POA: Diagnosis not present

## 2021-04-17 DIAGNOSIS — K219 Gastro-esophageal reflux disease without esophagitis: Secondary | ICD-10-CM | POA: Diagnosis not present

## 2021-04-17 DIAGNOSIS — M069 Rheumatoid arthritis, unspecified: Secondary | ICD-10-CM | POA: Diagnosis not present

## 2021-04-17 DIAGNOSIS — J4550 Severe persistent asthma, uncomplicated: Secondary | ICD-10-CM | POA: Diagnosis not present

## 2021-04-17 DIAGNOSIS — N393 Stress incontinence (female) (male): Secondary | ICD-10-CM | POA: Diagnosis not present

## 2021-04-17 DIAGNOSIS — N182 Chronic kidney disease, stage 2 (mild): Secondary | ICD-10-CM | POA: Diagnosis not present

## 2021-04-17 DIAGNOSIS — D696 Thrombocytopenia, unspecified: Secondary | ICD-10-CM | POA: Diagnosis not present

## 2021-04-17 DIAGNOSIS — J302 Other seasonal allergic rhinitis: Secondary | ICD-10-CM | POA: Diagnosis not present

## 2021-04-25 DIAGNOSIS — L97522 Non-pressure chronic ulcer of other part of left foot with fat layer exposed: Secondary | ICD-10-CM | POA: Diagnosis not present

## 2021-04-25 DIAGNOSIS — E11621 Type 2 diabetes mellitus with foot ulcer: Secondary | ICD-10-CM | POA: Diagnosis not present

## 2021-04-25 DIAGNOSIS — Z09 Encounter for follow-up examination after completed treatment for conditions other than malignant neoplasm: Secondary | ICD-10-CM | POA: Diagnosis not present

## 2021-05-20 DIAGNOSIS — I10 Essential (primary) hypertension: Secondary | ICD-10-CM | POA: Diagnosis not present

## 2021-05-20 DIAGNOSIS — K219 Gastro-esophageal reflux disease without esophagitis: Secondary | ICD-10-CM | POA: Diagnosis not present

## 2021-05-30 DIAGNOSIS — R0600 Dyspnea, unspecified: Secondary | ICD-10-CM | POA: Diagnosis not present

## 2021-05-30 DIAGNOSIS — J4540 Moderate persistent asthma, uncomplicated: Secondary | ICD-10-CM | POA: Diagnosis not present

## 2021-05-30 DIAGNOSIS — J302 Other seasonal allergic rhinitis: Secondary | ICD-10-CM | POA: Diagnosis not present

## 2021-05-30 DIAGNOSIS — R011 Cardiac murmur, unspecified: Secondary | ICD-10-CM | POA: Diagnosis not present

## 2021-06-17 DIAGNOSIS — D696 Thrombocytopenia, unspecified: Secondary | ICD-10-CM | POA: Diagnosis not present

## 2021-07-04 DIAGNOSIS — I447 Left bundle-branch block, unspecified: Secondary | ICD-10-CM | POA: Diagnosis not present

## 2021-07-04 DIAGNOSIS — I10 Essential (primary) hypertension: Secondary | ICD-10-CM | POA: Diagnosis not present

## 2021-07-04 DIAGNOSIS — I872 Venous insufficiency (chronic) (peripheral): Secondary | ICD-10-CM | POA: Diagnosis not present

## 2021-07-04 DIAGNOSIS — L97529 Non-pressure chronic ulcer of other part of left foot with unspecified severity: Secondary | ICD-10-CM | POA: Diagnosis not present

## 2021-07-08 DIAGNOSIS — H2513 Age-related nuclear cataract, bilateral: Secondary | ICD-10-CM | POA: Diagnosis not present

## 2021-08-13 DIAGNOSIS — H2511 Age-related nuclear cataract, right eye: Secondary | ICD-10-CM | POA: Diagnosis not present

## 2021-08-20 DIAGNOSIS — E782 Mixed hyperlipidemia: Secondary | ICD-10-CM | POA: Diagnosis not present

## 2021-08-20 DIAGNOSIS — I10 Essential (primary) hypertension: Secondary | ICD-10-CM | POA: Diagnosis not present

## 2021-09-03 DIAGNOSIS — H2512 Age-related nuclear cataract, left eye: Secondary | ICD-10-CM | POA: Diagnosis not present

## 2021-09-04 DIAGNOSIS — J22 Unspecified acute lower respiratory infection: Secondary | ICD-10-CM | POA: Diagnosis not present

## 2021-09-04 DIAGNOSIS — Z20822 Contact with and (suspected) exposure to covid-19: Secondary | ICD-10-CM | POA: Diagnosis not present

## 2021-09-04 DIAGNOSIS — Z20828 Contact with and (suspected) exposure to other viral communicable diseases: Secondary | ICD-10-CM | POA: Diagnosis not present

## 2021-09-18 DIAGNOSIS — B029 Zoster without complications: Secondary | ICD-10-CM | POA: Diagnosis not present

## 2021-10-01 DIAGNOSIS — M3500 Sicca syndrome, unspecified: Secondary | ICD-10-CM | POA: Diagnosis not present

## 2021-10-01 DIAGNOSIS — B029 Zoster without complications: Secondary | ICD-10-CM | POA: Diagnosis not present

## 2021-10-01 DIAGNOSIS — M059 Rheumatoid arthritis with rheumatoid factor, unspecified: Secondary | ICD-10-CM | POA: Diagnosis not present

## 2021-10-01 DIAGNOSIS — Z79899 Other long term (current) drug therapy: Secondary | ICD-10-CM | POA: Diagnosis not present

## 2021-10-14 DIAGNOSIS — Z0000 Encounter for general adult medical examination without abnormal findings: Secondary | ICD-10-CM | POA: Diagnosis not present

## 2021-10-14 DIAGNOSIS — N182 Chronic kidney disease, stage 2 (mild): Secondary | ICD-10-CM | POA: Diagnosis not present

## 2021-10-17 DIAGNOSIS — M069 Rheumatoid arthritis, unspecified: Secondary | ICD-10-CM | POA: Diagnosis not present

## 2021-10-17 DIAGNOSIS — J4550 Severe persistent asthma, uncomplicated: Secondary | ICD-10-CM | POA: Diagnosis not present

## 2021-10-17 DIAGNOSIS — D696 Thrombocytopenia, unspecified: Secondary | ICD-10-CM | POA: Diagnosis not present

## 2021-10-17 DIAGNOSIS — Z0001 Encounter for general adult medical examination with abnormal findings: Secondary | ICD-10-CM | POA: Diagnosis not present

## 2021-10-17 DIAGNOSIS — K219 Gastro-esophageal reflux disease without esophagitis: Secondary | ICD-10-CM | POA: Diagnosis not present

## 2021-10-17 DIAGNOSIS — I129 Hypertensive chronic kidney disease with stage 1 through stage 4 chronic kidney disease, or unspecified chronic kidney disease: Secondary | ICD-10-CM | POA: Diagnosis not present

## 2021-10-17 DIAGNOSIS — N182 Chronic kidney disease, stage 2 (mild): Secondary | ICD-10-CM | POA: Diagnosis not present

## 2021-10-17 DIAGNOSIS — J302 Other seasonal allergic rhinitis: Secondary | ICD-10-CM | POA: Diagnosis not present

## 2021-11-17 DIAGNOSIS — K219 Gastro-esophageal reflux disease without esophagitis: Secondary | ICD-10-CM | POA: Diagnosis not present

## 2021-11-17 DIAGNOSIS — I129 Hypertensive chronic kidney disease with stage 1 through stage 4 chronic kidney disease, or unspecified chronic kidney disease: Secondary | ICD-10-CM | POA: Diagnosis not present

## 2021-11-17 DIAGNOSIS — N182 Chronic kidney disease, stage 2 (mild): Secondary | ICD-10-CM | POA: Diagnosis not present

## 2021-11-18 DIAGNOSIS — Z719 Counseling, unspecified: Secondary | ICD-10-CM | POA: Diagnosis not present

## 2021-11-18 DIAGNOSIS — Z1231 Encounter for screening mammogram for malignant neoplasm of breast: Secondary | ICD-10-CM | POA: Diagnosis not present

## 2021-11-18 DIAGNOSIS — Z9229 Personal history of other drug therapy: Secondary | ICD-10-CM | POA: Diagnosis not present

## 2021-11-18 DIAGNOSIS — Z01419 Encounter for gynecological examination (general) (routine) without abnormal findings: Secondary | ICD-10-CM | POA: Diagnosis not present

## 2021-11-25 DIAGNOSIS — R0602 Shortness of breath: Secondary | ICD-10-CM | POA: Diagnosis not present

## 2021-11-25 DIAGNOSIS — J019 Acute sinusitis, unspecified: Secondary | ICD-10-CM | POA: Diagnosis not present

## 2021-11-25 DIAGNOSIS — R059 Cough, unspecified: Secondary | ICD-10-CM | POA: Diagnosis not present

## 2021-11-25 DIAGNOSIS — R519 Headache, unspecified: Secondary | ICD-10-CM | POA: Diagnosis not present

## 2022-02-19 DIAGNOSIS — J45909 Unspecified asthma, uncomplicated: Secondary | ICD-10-CM | POA: Diagnosis not present

## 2022-02-19 DIAGNOSIS — J4551 Severe persistent asthma with (acute) exacerbation: Secondary | ICD-10-CM | POA: Diagnosis not present

## 2022-02-19 DIAGNOSIS — J4550 Severe persistent asthma, uncomplicated: Secondary | ICD-10-CM | POA: Diagnosis not present

## 2022-03-28 DIAGNOSIS — R053 Chronic cough: Secondary | ICD-10-CM | POA: Diagnosis not present

## 2022-03-28 DIAGNOSIS — R600 Localized edema: Secondary | ICD-10-CM | POA: Diagnosis not present

## 2022-03-28 DIAGNOSIS — U099 Post covid-19 condition, unspecified: Secondary | ICD-10-CM | POA: Diagnosis not present

## 2022-04-02 DIAGNOSIS — Z9181 History of falling: Secondary | ICD-10-CM | POA: Diagnosis not present

## 2022-04-02 DIAGNOSIS — R600 Localized edema: Secondary | ICD-10-CM | POA: Diagnosis not present

## 2022-04-02 DIAGNOSIS — M19042 Primary osteoarthritis, left hand: Secondary | ICD-10-CM | POA: Diagnosis not present

## 2022-04-02 DIAGNOSIS — M170 Bilateral primary osteoarthritis of knee: Secondary | ICD-10-CM | POA: Diagnosis not present

## 2022-04-02 DIAGNOSIS — M19041 Primary osteoarthritis, right hand: Secondary | ICD-10-CM | POA: Diagnosis not present

## 2022-04-02 DIAGNOSIS — R2681 Unsteadiness on feet: Secondary | ICD-10-CM | POA: Diagnosis not present

## 2022-04-02 DIAGNOSIS — M059 Rheumatoid arthritis with rheumatoid factor, unspecified: Secondary | ICD-10-CM | POA: Diagnosis not present

## 2022-04-02 DIAGNOSIS — Z79899 Other long term (current) drug therapy: Secondary | ICD-10-CM | POA: Diagnosis not present

## 2022-04-02 DIAGNOSIS — Z23 Encounter for immunization: Secondary | ICD-10-CM | POA: Diagnosis not present

## 2022-04-11 DIAGNOSIS — R053 Chronic cough: Secondary | ICD-10-CM | POA: Diagnosis not present

## 2022-04-16 DIAGNOSIS — N182 Chronic kidney disease, stage 2 (mild): Secondary | ICD-10-CM | POA: Diagnosis not present

## 2022-04-16 DIAGNOSIS — I10 Essential (primary) hypertension: Secondary | ICD-10-CM | POA: Diagnosis not present

## 2022-04-19 DIAGNOSIS — N182 Chronic kidney disease, stage 2 (mild): Secondary | ICD-10-CM | POA: Diagnosis not present

## 2022-04-19 DIAGNOSIS — I129 Hypertensive chronic kidney disease with stage 1 through stage 4 chronic kidney disease, or unspecified chronic kidney disease: Secondary | ICD-10-CM | POA: Diagnosis not present

## 2022-04-19 DIAGNOSIS — J4550 Severe persistent asthma, uncomplicated: Secondary | ICD-10-CM | POA: Diagnosis not present

## 2022-04-22 DIAGNOSIS — J302 Other seasonal allergic rhinitis: Secondary | ICD-10-CM | POA: Diagnosis not present

## 2022-04-22 DIAGNOSIS — R160 Hepatomegaly, not elsewhere classified: Secondary | ICD-10-CM | POA: Diagnosis not present

## 2022-04-22 DIAGNOSIS — N189 Chronic kidney disease, unspecified: Secondary | ICD-10-CM | POA: Diagnosis not present

## 2022-04-22 DIAGNOSIS — I129 Hypertensive chronic kidney disease with stage 1 through stage 4 chronic kidney disease, or unspecified chronic kidney disease: Secondary | ICD-10-CM | POA: Diagnosis not present

## 2022-04-22 DIAGNOSIS — D696 Thrombocytopenia, unspecified: Secondary | ICD-10-CM | POA: Diagnosis not present

## 2022-04-22 DIAGNOSIS — Z6836 Body mass index (BMI) 36.0-36.9, adult: Secondary | ICD-10-CM | POA: Diagnosis not present

## 2022-04-22 DIAGNOSIS — M069 Rheumatoid arthritis, unspecified: Secondary | ICD-10-CM | POA: Diagnosis not present

## 2022-05-01 DIAGNOSIS — X32XXXA Exposure to sunlight, initial encounter: Secondary | ICD-10-CM | POA: Diagnosis not present

## 2022-05-01 DIAGNOSIS — L821 Other seborrheic keratosis: Secondary | ICD-10-CM | POA: Diagnosis not present

## 2022-05-01 DIAGNOSIS — L728 Other follicular cysts of the skin and subcutaneous tissue: Secondary | ICD-10-CM | POA: Diagnosis not present

## 2022-05-01 DIAGNOSIS — I872 Venous insufficiency (chronic) (peripheral): Secondary | ICD-10-CM | POA: Diagnosis not present

## 2022-05-01 DIAGNOSIS — L570 Actinic keratosis: Secondary | ICD-10-CM | POA: Diagnosis not present

## 2022-05-08 ENCOUNTER — Encounter: Payer: Self-pay | Admitting: Internal Medicine

## 2022-05-28 DIAGNOSIS — J45909 Unspecified asthma, uncomplicated: Secondary | ICD-10-CM | POA: Diagnosis not present

## 2022-05-28 DIAGNOSIS — J4550 Severe persistent asthma, uncomplicated: Secondary | ICD-10-CM | POA: Diagnosis not present

## 2022-06-15 ENCOUNTER — Encounter: Payer: Self-pay | Admitting: Gastroenterology

## 2022-06-15 NOTE — Progress Notes (Deleted)
GI Office Note    Referring Provider: Leone Payor, FNP Primary Care Physician:  Leone Payor, FNP  Primary Gastroenterologist: Gerrit Friends.Rourk, MD  Chief Complaint   No chief complaint on file.  History of Present Illness   Sheila Velez is a 71 y.o. female presenting today at the request of Leone Payor, FNP for hepatomegaly.  Labs via Care Everywhere 04/02/22:  Hgb 13.2, MCV 93, plts 139. AST and ALT normal. Cr 0.9, BNP 48.  Per review of Care Everywhere patient is due for colonoscopy. No prior reports on file for patient     No current outpatient medications on file.   No current facility-administered medications for this visit.    No past medical history on file.  *** The histories are not reviewed yet. Please review them in the "History" navigator section and refresh this SmartLink.  No family history on file.  Allergies as of 06/16/2022   (Not on File)    Social History   Socioeconomic History   Marital status: Married    Spouse name: Not on file   Number of children: Not on file   Years of education: Not on file   Highest education level: Not on file  Occupational History   Not on file  Tobacco Use   Smoking status: Not on file   Smokeless tobacco: Not on file  Substance and Sexual Activity   Alcohol use: Not on file   Drug use: Not on file   Sexual activity: Not on file  Other Topics Concern   Not on file  Social History Narrative   Not on file   Social Determinants of Health   Financial Resource Strain: Not on file  Food Insecurity: Not on file  Transportation Needs: Not on file  Physical Activity: Not on file  Stress: Not on file  Social Connections: Not on file  Intimate Partner Violence: Not on file     Review of Systems   Gen: Denies any fever, chills, fatigue, weight loss, lack of appetite.  CV: Denies chest pain, heart palpitations, peripheral edema, syncope.  Resp: Denies shortness of breath at rest or with exertion.  Denies wheezing or cough.  GI: see HPI GU : Denies urinary burning, urinary frequency, urinary hesitancy MS: Denies joint pain, muscle weakness, cramps, or limitation of movement.  Derm: Denies rash, itching, dry skin Psych: Denies depression, anxiety, memory loss, and confusion Heme: Denies bruising, bleeding, and enlarged lymph nodes.   Physical Exam   There were no vitals taken for this visit.  General:   Alert and oriented. Pleasant and cooperative. Well-nourished and well-developed.  Head:  Normocephalic and atraumatic. Eyes:  Without icterus, sclera clear and conjunctiva pink.  Ears:  Normal auditory acuity. Mouth:  No deformity or lesions, oral mucosa pink.  Lungs:  Clear to auscultation bilaterally. No wheezes, rales, or rhonchi. No distress.  Heart:  S1, S2 present without murmurs appreciated.  Abdomen:  +BS, soft, non-tender and non-distended. No HSM noted. No guarding or rebound. No masses appreciated.  Rectal:  Deferred  Msk:  Symmetrical without gross deformities. Normal posture. Extremities:  Without edema. Neurologic:  Alert and  oriented x4;  grossly normal neurologically. Skin:  Intact without significant lesions or rashes. Psych:  Alert and cooperative. Normal mood and affect.   Assessment   Sheila Velez is a 71 y.o. female with a history of rheumatoid arthritis, chronic kidney disease, and HTN*** presenting today for evaluation of hepatomegaly.   Hepatomegaly:  PLAN   ***    Venetia Night, MSN, FNP-BC, AGACNP-BC [facility]

## 2022-06-16 ENCOUNTER — Ambulatory Visit: Payer: BC Managed Care – PPO | Admitting: Gastroenterology

## 2022-07-16 NOTE — Progress Notes (Unsigned)
  GI Office Note    Referring Provider: Farmer, Eboni, FNP Primary Care Physician:  Farmer, Eboni, FNP  Primary Gastroenterologist: Robert M.Rourk, MD  Chief Complaint   No chief complaint on file.   History of Present Illness   Sheila Velez is a 71 y.o. female presenting today at the request of Farmer, Eboni, FNP for ***hepatomegaly.  Labs 04/02/22: Hgb 13.2, AST 35, ALT 17, Cr 0.9, eGFR 68, BNP 48  PCP visit note 04/22/22 reviewed. Patient requested PT referral for arthritis. Noted to be morbidly obese. Follows with rheumatology at [facility] for RA. Reported seasonal allergies and seen by ENT who ordered CT scan and hepatomegaly was noted. Report/scan not available to PCP. Noted history of thrombocytopenia and given report of hepatomegaly was referred to GI for further evaluation.    Today: Which ENT? Where was CT scan performed?   No current outpatient medications on file.   No current facility-administered medications for this visit.    Past Medical History:  Diagnosis Date   Rheumatoid arthritis (HCC)     *** The histories are not reviewed yet. Please review them in the "History" navigator section and refresh this SmartLink.  No family history on file.  Allergies as of 07/17/2022   (Not on File)    Social History   Socioeconomic History   Marital status: Married    Spouse name: Not on file   Number of children: Not on file   Years of education: Not on file   Highest education level: Not on file  Occupational History   Not on file  Tobacco Use   Smoking status: Not on file   Smokeless tobacco: Not on file  Substance and Sexual Activity   Alcohol use: Not on file   Drug use: Not on file   Sexual activity: Not on file  Other Topics Concern   Not on file  Social History Narrative   Not on file   Social Determinants of Health   Financial Resource Strain: Not on file  Food Insecurity: Not on file  Transportation Needs: Not on file  Physical  Activity: Not on file  Stress: Not on file  Social Connections: Not on file  Intimate Partner Violence: Not on file     Review of Systems  *** Gen: Denies any fever, chills, fatigue, weight loss, lack of appetite.  CV: Denies chest pain, heart palpitations, peripheral edema, syncope.  Resp: Denies shortness of breath at rest or with exertion. Denies wheezing or cough.  GI: see HPI GU : Denies urinary burning, urinary frequency, urinary hesitancy MS: Denies joint pain, muscle weakness, cramps, or limitation of movement.  Derm: Denies rash, itching, dry skin Psych: Denies depression, anxiety, memory loss, and confusion Heme: Denies bruising, bleeding, and enlarged lymph nodes.   Physical Exam   There were no vitals taken for this visit.  General:   Alert and oriented. Pleasant and cooperative. Well-nourished and well-developed.  Head:  Normocephalic and atraumatic. Eyes:  Without icterus, sclera clear and conjunctiva pink.  Ears:  Normal auditory acuity. Mouth:  No deformity or lesions, oral mucosa pink.  Lungs:  Clear to auscultation bilaterally. No wheezes, rales, or rhonchi. No distress.  Heart:  S1, S2 present without murmurs appreciated.  Abdomen:  +BS, soft, non-tender and non-distended. No HSM noted. No guarding or rebound. No masses appreciated.  Rectal:  Deferred *** Msk:  Symmetrical without gross deformities. Normal posture. Extremities:  Without edema. Neurologic:  Alert and  oriented x4;  grossly   normal neurologically. Skin:  Intact without significant lesions or rashes. Psych:  Alert and cooperative. Normal mood and affect.   Assessment   Sheila Velez is a 71 y.o. female with a history of CKD, Diabetes, asthma, GERD, HTN, RA, thrombocytopenia, *** presenting today for evaluation of hepatomegaly.   Hepatomegaly: Reported by PCP that was stated on CT scan performed by ENT. LFTs in September wnl.    PLAN   *** Request CT records RUQ U/S with  doppler CBC, BMP, HFP, A1c, alpha 1 antitrypsin deficiency, ceruloplasmin, INR, acute hepatitis panel    Venetia Night, MSN, FNP-BC, AGACNP-BC [facility]

## 2022-07-16 NOTE — H&P (View-Only) (Signed)
GI Office Note    Referring Provider: Leone Payor, FNP Primary Care Physician:  Leone Payor, FNP  Primary Gastroenterologist: Gerrit Friends.Rourk, MD  Chief Complaint   Chief Complaint  Patient presents with   Fatty Liver    History of Present Illness   Sheila Velez is a 71 y.o. female presenting today at the request of Leone Payor, FNP for hepatomegaly.  Labs 04/02/22: Hgb 13.2, plts 139, AST 35, ALT 17, bilirubin 1.2, albumin 3.8, Cr 0.9, eGFR 68, BNP 48  Labs 04/16/22: Normal LFTs, plts 100  PCP visit note 04/22/22 reviewed. Patient requested PT referral for arthritis. Noted to be morbidly obese. Follows with rheumatology at [facility] for RA. Reported seasonal allergies and seen by ENT who ordered CT scan and hepatomegaly was noted. Report/scan not available to PCP. Noted history of thrombocytopenia and given report of hepatomegaly was referred to GI for further evaluation.    Today: Seen ENT for a chronic cough and they performed a CT scan of her chest and abdomen. Talked to her PCP about it and they referred her here.   CT scan done in [place] at the imaging center.   Denies abdominal pain, jaundice, dark urine, melena, brbrp, lack of appetite, early satiety, energy level, weight gain. Has lost 30lb intentionally over the last year. Is exercising regularly. Does not walk well but does do many exercises. Goes to the [facility]. Blood pressure has been stable. Cholesterol has been good.   Due to have labs in March 28 for Dr. Margo Aye (PCP).  Has been having some issues with dysphagia again recently. Gets choked on basked chicken, breads. Last had it stretched about 6 years ago. Has had 3 EGD in the past all been done in [place]. No issues with liquids or pills. Denies any reflux while taking pantoprazole.   Has had some swelling in her lower extremities but no recent worsening.    Current Outpatient Medications  Medication Sig Dispense Refill   Krill Oil 1000 MG CAPS Take by  mouth.     leflunomide (ARAVA) 20 MG tablet Take 1 tablet by mouth daily.     pantoprazole (PROTONIX) 40 MG tablet TAKE ONE TABLET BY MOUTH WITH BREAKFAST FOR stomach acid     sodium chloride (OCEAN) 0.65 % nasal spray TAKE 1 SPRAY TWICE A DAY BY NASAL ROUTE AS DIRECTED.     TRELEGY ELLIPTA 200-62.5-25 MCG/ACT AEPB Inhale 1 puff into the lungs daily.     valsartan-hydrochlorothiazide (DIOVAN-HCT) 160-12.5 MG tablet TAKE ONE TABLET BY MOUTH EVERY MORNING WITH BREAKFAST FOR BLOOD PRESSURE     Vitamin D, Ergocalciferol, (DRISDOL) 1.25 MG (50000 UNIT) CAPS capsule Take 50,000 Units by mouth every 7 (seven) days.     No current facility-administered medications for this visit.    Past Medical History:  Diagnosis Date   Rheumatoid arthritis (HCC)     History reviewed. No pertinent surgical history.  History reviewed. No pertinent family history.  Allergies as of 07/17/2022 - Review Complete 07/17/2022  Allergen Reaction Noted   Sulfamethoxazole-trimethoprim Hives and Nausea And Vomiting 06/30/2018    Social History   Socioeconomic History   Marital status: Married    Spouse name: Not on file   Number of children: Not on file   Years of education: Not on file   Highest education level: Not on file  Occupational History   Not on file  Tobacco Use   Smoking status: Never   Smokeless tobacco: Never  Substance and Sexual Activity  Alcohol use: Not Currently   Drug use: Never   Sexual activity: Not Currently  Other Topics Concern   Not on file  Social History Narrative   Not on file   Social Determinants of Health   Financial Resource Strain: Not on file  Food Insecurity: Not on file  Transportation Needs: Not on file  Physical Activity: Not on file  Stress: Not on file  Social Connections: Not on file  Intimate Partner Violence: Not on file     Review of Systems   Gen: Denies any fever, chills, fatigue, weight loss, lack of appetite.  CV: Denies chest pain, heart  palpitations, peripheral edema, syncope.  Resp: Denies shortness of breath at rest or with exertion. Denies wheezing or cough.  GI: see HPI GU : Denies urinary burning, urinary frequency, urinary hesitancy MS: + joint pain. Denies muscle weakness, cramps, or limitation of movement.  Derm: Denies rash, itching, dry skin Psych: Denies depression, anxiety, memory loss, and confusion Heme: Denies bruising, bleeding, and enlarged lymph nodes.   Physical Exam   BP 115/72 (BP Location: Right Arm, Patient Position: Sitting, Cuff Size: Normal)   Pulse 80   Temp (!) 97.4 F (36.3 C) (Temporal)   Ht 5' (1.524 m)   Wt 193 lb (87.5 kg)   SpO2 97%   BMI 37.69 kg/m   General:   Alert and oriented. Pleasant and cooperative. Well-nourished and well-developed.  Head:  Normocephalic and atraumatic. Eyes:  Without icterus, sclera clear and conjunctiva pink.  Ears:  Normal auditory acuity. Mouth:  No deformity or lesions, oral mucosa pink.  Lungs:  Clear to auscultation bilaterally. No wheezes, rales, or rhonchi. No distress.  Heart:  S1, S2 present without murmurs appreciated.  Abdomen:  +BS, soft, non-tender and non-distended. No HSM noted. No guarding or rebound. No masses appreciated.  Rectal:  Deferred  Msk:  Symmetrical without gross deformities. Normal posture. Extremities:  Without edema. Neurologic:  Alert and  oriented x4;  grossly normal neurologically. Skin:  Intact without significant lesions or rashes. Psych:  Alert and cooperative. Normal mood and affect.   Assessment   Sheila Velez is a 71 y.o. female with a history of CKD, Diabetes, asthma, GERD, HTN, RA, thrombocytopenia,  presenting today for evaluation of hepatomegaly.   Hepatomegaly: Reported by PCP that was stated on CT scan performed by ENT. LFTs in September wnl.  States her CT was performed of her chest and abdomen and was done at [facility] in [place].  Denies any jaundice, pruritus, mental status changes,  ascites, bleeding episodes, nausea, vomiting, dark urine, lack of appetite, unintentional weight loss.  Thrombocytopenia: States her rheumatologist thinks it is not from her medication however states he PCP has mentioned before that it could be contributing to it. Has labile platelet levels per patient. Denies any bleeding episodes.  Platelet count mid-September 139, platelet count late September 100.  Could be secondary to medication however could also be secondary to hepatomegaly.  She denied CT scan reporting any cirrhosis.  As stated above we will request these records and perform additional lab testing and imaging if needed.  Hemoglobin stable without any anemia.  Dysphagia, GERD: Does have history of chronic GERD which is controlled with pantoprazole 40 mg daily.  She does report a history of dysphagia requiring esophageal dilations.  States she has had about 3 of these done and typically needs them done every 6-7 years.  States her last 1 was done about 6 years ago with [facility]  gastroenterology.  She recently has been having trouble with chicken and breads getting stuck in her throat.  She states when this occurs she usually needs stretching again and usually has immediate relief of symptoms afterwards.  We will request prior EGD records from [facility] gastroenterology and schedule her for EGD with dilation in near future with Dr. Jena Gauss as requested.  PLAN   Request CT records May perform RUQ U/S with doppler pending review of CT records.  Acute hepatitis panel, HFP Recommend 1-2# weight loss per week until ideal body weight through exercise & diet. Low fat/cholesterol diet.   Avoid sweets, sodas, fruit juices, sweetened beverages like tea, etc. Gradually increase exercise from 15 min daily up to 1 hr per day 5 days/week. Limit alcohol use. Request prior EGD records from [facility] Continue pantoprazole 40 mg daily Proceed with upper endoscopy with dilation with propofol by  Dr. Jena Gauss in near future: the risks, benefits, and alternatives have been discussed with the patient in detail. The patient states understanding and desires to proceed. ASA 2 BMP pre-op Follow-up 2 months post procedure.   Brooke Bonito, MSN, FNP-BC, AGACNP-BC [facility]

## 2022-07-17 ENCOUNTER — Ambulatory Visit (INDEPENDENT_AMBULATORY_CARE_PROVIDER_SITE_OTHER): Payer: Medicare PPO | Admitting: Gastroenterology

## 2022-07-17 ENCOUNTER — Other Ambulatory Visit: Payer: Self-pay | Admitting: *Deleted

## 2022-07-17 ENCOUNTER — Encounter: Payer: Self-pay | Admitting: Gastroenterology

## 2022-07-17 ENCOUNTER — Encounter: Payer: Self-pay | Admitting: *Deleted

## 2022-07-17 VITALS — BP 115/72 | HR 80 | Temp 97.4°F | Ht 60.0 in | Wt 193.0 lb

## 2022-07-17 DIAGNOSIS — K219 Gastro-esophageal reflux disease without esophagitis: Secondary | ICD-10-CM

## 2022-07-17 DIAGNOSIS — R1310 Dysphagia, unspecified: Secondary | ICD-10-CM

## 2022-07-17 DIAGNOSIS — R160 Hepatomegaly, not elsewhere classified: Secondary | ICD-10-CM | POA: Diagnosis not present

## 2022-07-17 DIAGNOSIS — D696 Thrombocytopenia, unspecified: Secondary | ICD-10-CM

## 2022-07-17 NOTE — Patient Instructions (Addendum)
Instructions for fatty liver: Recommend 1-2# weight loss per week until ideal body weight through exercise & diet. Low fat/cholesterol diet.   Avoid sweets, sodas, fruit juices, sweetened beverages like tea, etc. Gradually increase exercise from 15 min daily up to 1 hr per day 5 days/week. Limit alcohol use.  We are scheduling you for an EGD with dilation in the near future with Dr. Jena Gauss.  Our schedulers will reach out to you with an appointment time.  I am ordering lab work for you have completed at your earliest convenience.  This is to recheck your liver enzymes and to rule out acute hepatitis as close for your enlarged liver.  Please make sure you fill out a records request form at the front desk to get your prior CT records from [place] [facility] as well as your prior EGD records from [facility] gastroenterology.  I would like to review your CT records to ensure we do not need to perform a follow-up right upper quadrant ultrasound to further evaluate your liver.  It was a pleasure meeting you today!  I hope you have a happy new year!  We will plan to follow-up a couple months after your upper endoscopy and dilation to ensure your swallowing is improved.

## 2022-07-31 ENCOUNTER — Encounter: Payer: Self-pay | Admitting: Internal Medicine

## 2022-07-31 DIAGNOSIS — R160 Hepatomegaly, not elsewhere classified: Secondary | ICD-10-CM | POA: Diagnosis not present

## 2022-08-01 ENCOUNTER — Telehealth: Payer: Self-pay | Admitting: Gastroenterology

## 2022-08-01 LAB — HEPATITIS A ANTIBODY, TOTAL: hep A Total Ab: POSITIVE — AB

## 2022-08-01 LAB — HEPATIC FUNCTION PANEL
ALT: 16 IU/L (ref 0–32)
AST: 32 IU/L (ref 0–40)
Albumin: 3.6 g/dL — ABNORMAL LOW (ref 3.8–4.8)
Alkaline Phosphatase: 123 IU/L — ABNORMAL HIGH (ref 44–121)
Bilirubin Total: 0.8 mg/dL (ref 0.0–1.2)
Bilirubin, Direct: 0.36 mg/dL (ref 0.00–0.40)
Total Protein: 5.8 g/dL — ABNORMAL LOW (ref 6.0–8.5)

## 2022-08-01 LAB — HEPATITIS B SURFACE ANTIBODY,QUALITATIVE: Hep B Surface Ab, Qual: NONREACTIVE

## 2022-08-01 LAB — HEPATITIS C ANTIBODY: Hep C Virus Ab: NONREACTIVE

## 2022-08-01 LAB — HEPATITIS A ANTIBODY, IGM: Hep A IgM: NEGATIVE

## 2022-08-01 LAB — HEPATITIS B CORE ANTIBODY, TOTAL: Hep B Core Total Ab: NEGATIVE

## 2022-08-01 LAB — HEPATITIS B SURFACE ANTIGEN: Hepatitis B Surface Ag: NEGATIVE

## 2022-08-01 NOTE — Telephone Encounter (Signed)
//  Records received from [facility].  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   EGD 03/18/18: -normal duodenum -mild to moderate erythema of antrum and body consistent with gastritis -7cm hiatal hernia -esophagus normal s/p biopsy -stomach biopsy with mild chronic gastritis, negative H. Pylori -no pathologic change of esophageal biopsy. -Pantoprazole mg daily advised.    Records from [facility] received:  CT chest w/o contrast Impression 04/11/22: Mild bronchiectasis No pulmonary nodules Enlargement of caudate liver and nodular contour of liver suggesting cirrhosis Splenomegaly Large hiatal hernia.   KUB 09/25/20 impression: -abnormal lucency overly right lower abdomen may reflect artifact. Consider supine and upright films to further assess.   MRCP 02/08/18 impression: -dilated CBD secondary to filling defects with small gallstones -splenomegaly  RUQ [place] July 2019 impression: -hepatomegaly with findings suggestive of cirrhosis and hepatic steatosis -cholelithiasis with dilated CBD.    Patient has had signs of cirrhosis on imaging dating back to at least 2019 with evidence of hepatic steatosis. Hepatomegaly likely secondary to cirrhosis given known splenomegaly and thrombocytopenia. Previous diet recommendations remain in place. Labs and HFP performed. Mild elevation in lab however viral hepatitis negative. Could perform elastography however likely has cirrhosis given low platelets and .splenomegaly. Faty liver diet advised previously.   Venetia Night, MSN, APRN, FNP-BC, AGACNP-BC [facility]

## 2022-08-05 ENCOUNTER — Telehealth: Payer: Self-pay | Admitting: *Deleted

## 2022-08-05 NOTE — Telephone Encounter (Signed)
[employer] PA: Approved Authorization #[id number]  Tracking #[id number] DOS: 08/07/22-10/10/22

## 2022-08-06 ENCOUNTER — Other Ambulatory Visit (HOSPITAL_COMMUNITY)
Admission: RE | Admit: 2022-08-06 | Discharge: 2022-08-06 | Disposition: A | Discharge status: Home / Self Care | Payer: Medicare PPO | Source: Ambulatory Visit | Attending: Internal Medicine | Admitting: Internal Medicine

## 2022-08-06 ENCOUNTER — Other Ambulatory Visit: Payer: Self-pay | Admitting: *Deleted

## 2022-08-06 DIAGNOSIS — D696 Thrombocytopenia, unspecified: Secondary | ICD-10-CM | POA: Diagnosis not present

## 2022-08-06 DIAGNOSIS — R7989 Other specified abnormal findings of blood chemistry: Secondary | ICD-10-CM

## 2022-08-06 DIAGNOSIS — R160 Hepatomegaly, not elsewhere classified: Secondary | ICD-10-CM | POA: Insufficient documentation

## 2022-08-06 LAB — BASIC METABOLIC PANEL
Anion gap: 8 (ref 5–15)
BUN: 14 mg/dL (ref 8–23)
CO2: 27 mmol/L (ref 22–32)
Calcium: 8.6 mg/dL — ABNORMAL LOW (ref 8.9–10.3)
Chloride: 100 mmol/L (ref 98–111)
Creatinine, Ser: 0.87 mg/dL (ref 0.44–1.00)
GFR, Estimated: >60 mL/min (ref >60)
Glucose, Bld: 104 mg/dL — ABNORMAL HIGH (ref 70–99)
Potassium: 3.4 mmol/L — ABNORMAL LOW (ref 3.5–5.1)
Sodium: 135 mmol/L (ref 135–145)

## 2022-08-06 LAB — HEPATIC FUNCTION PANEL
ALT: 20 U/L (ref 0–44)
AST: 39 U/L (ref 15–41)
Albumin: 3.4 g/dL — ABNORMAL LOW (ref 3.5–5.0)
Alkaline Phosphatase: 109 U/L (ref 38–126)
Bilirubin, Direct: 0.3 mg/dL — ABNORMAL HIGH (ref 0.0–0.2)
Indirect Bilirubin: 0.9 mg/dL (ref 0.3–0.9)
Total Bilirubin: 1.2 mg/dL (ref 0.3–1.2)
Total Protein: 6.2 g/dL — ABNORMAL LOW (ref 6.5–8.1)

## 2022-08-06 LAB — GAMMA GT: GGT: 40 U/L (ref 7–50)

## 2022-08-07 ENCOUNTER — Ambulatory Visit (HOSPITAL_COMMUNITY)
Admission: RE | Admit: 2022-08-07 | Discharge: 2022-08-07 | Disposition: A | Discharge status: Home / Self Care | Payer: Medicare PPO | Attending: Internal Medicine | Admitting: Internal Medicine

## 2022-08-07 ENCOUNTER — Ambulatory Visit (HOSPITAL_BASED_OUTPATIENT_CLINIC_OR_DEPARTMENT_OTHER): Payer: Medicare PPO | Admitting: Certified Registered Nurse Anesthetist

## 2022-08-07 ENCOUNTER — Other Ambulatory Visit: Payer: Self-pay

## 2022-08-07 ENCOUNTER — Encounter (HOSPITAL_COMMUNITY)
Admission: RE | Disposition: A | Discharge status: Home / Self Care | Payer: Self-pay | Source: Home / Self Care | Attending: Internal Medicine

## 2022-08-07 ENCOUNTER — Encounter (HOSPITAL_COMMUNITY): Payer: Self-pay | Admitting: Internal Medicine

## 2022-08-07 ENCOUNTER — Ambulatory Visit (HOSPITAL_COMMUNITY): Payer: Medicare PPO | Admitting: Certified Registered Nurse Anesthetist

## 2022-08-07 DIAGNOSIS — K449 Diaphragmatic hernia without obstruction or gangrene: Secondary | ICD-10-CM

## 2022-08-07 DIAGNOSIS — K222 Esophageal obstruction: Secondary | ICD-10-CM

## 2022-08-07 DIAGNOSIS — R160 Hepatomegaly, not elsewhere classified: Secondary | ICD-10-CM | POA: Diagnosis not present

## 2022-08-07 DIAGNOSIS — N189 Chronic kidney disease, unspecified: Secondary | ICD-10-CM | POA: Insufficient documentation

## 2022-08-07 DIAGNOSIS — R1310 Dysphagia, unspecified: Secondary | ICD-10-CM | POA: Insufficient documentation

## 2022-08-07 DIAGNOSIS — D696 Thrombocytopenia, unspecified: Secondary | ICD-10-CM | POA: Insufficient documentation

## 2022-08-07 DIAGNOSIS — Z79899 Other long term (current) drug therapy: Secondary | ICD-10-CM | POA: Insufficient documentation

## 2022-08-07 DIAGNOSIS — Z6836 Body mass index (BMI) 36.0-36.9, adult: Secondary | ICD-10-CM | POA: Insufficient documentation

## 2022-08-07 DIAGNOSIS — K219 Gastro-esophageal reflux disease without esophagitis: Secondary | ICD-10-CM | POA: Insufficient documentation

## 2022-08-07 DIAGNOSIS — I129 Hypertensive chronic kidney disease with stage 1 through stage 4 chronic kidney disease, or unspecified chronic kidney disease: Secondary | ICD-10-CM | POA: Diagnosis not present

## 2022-08-07 DIAGNOSIS — M069 Rheumatoid arthritis, unspecified: Secondary | ICD-10-CM | POA: Diagnosis not present

## 2022-08-07 DIAGNOSIS — E1122 Type 2 diabetes mellitus with diabetic chronic kidney disease: Secondary | ICD-10-CM | POA: Diagnosis not present

## 2022-08-07 HISTORY — PX: MALONEY DILATION: SHX5535

## 2022-08-07 HISTORY — PX: ESOPHAGOGASTRODUODENOSCOPY (EGD) WITH PROPOFOL: SHX5813

## 2022-08-07 SURGERY — ESOPHAGOGASTRODUODENOSCOPY (EGD) WITH PROPOFOL
Anesthesia: General

## 2022-08-07 MED ORDER — LACTATED RINGERS IV SOLN: INTRAVENOUS | Status: DC | PRN

## 2022-08-07 MED ORDER — PROPOFOL 10 MG/ML IV BOLUS
INTRAVENOUS | Status: DC | PRN
  Administered 2022-08-07: 100 mg via INTRAVENOUS

## 2022-08-07 MED ORDER — LIDOCAINE 2% (20 MG/ML) 5 ML SYRINGE
INTRAMUSCULAR | Status: DC | PRN
  Administered 2022-08-07: 100 mg via INTRAVENOUS

## 2022-08-07 MED ORDER — PROPOFOL 500 MG/50ML IV EMUL
INTRAVENOUS | Status: DC | PRN
  Administered 2022-08-07: 150 ug/kg/min via INTRAVENOUS

## 2022-08-07 NOTE — Anesthesia Preprocedure Evaluation (Signed)
Anesthesia Evaluation  Patient identified by MRN, date of birth, ID band Patient awake    Reviewed: Allergy & Precautions, NPO status , Patient's Chart, lab work & pertinent test results, reviewed documented beta blocker date and time   Airway Mallampati: II  TM Distance: >3 FB Neck ROM: Full    Dental no notable dental hx.    Pulmonary neg pulmonary ROS   Pulmonary exam normal        Cardiovascular negative cardio ROS Normal cardiovascular exam     Neuro/Psych negative neurological ROS  negative psych ROS   GI/Hepatic Neg liver ROS,GERD  ,,  Endo/Other    Morbid obesity  Renal/GU negative Renal ROS     Musculoskeletal negative musculoskeletal ROS (+) Arthritis , Rheumatoid disorders,    Abdominal  (+) + obese  Peds  Hematology negative hematology ROS (+)   Anesthesia Other Findings   Reproductive/Obstetrics                             Anesthesia Physical Anesthesia Plan  ASA: 2  Anesthesia Plan: General   Post-op Pain Management: Minimal or no pain anticipated   Induction: Intravenous  PONV Risk Score and Plan: 2 and TIVA and Propofol infusion  Airway Management Planned: Nasal Cannula and Natural Airway  Additional Equipment:   Intra-op Plan:   Post-operative Plan:   Informed Consent: I have reviewed the patients History and Physical, chart, labs and discussed the procedure including the risks, benefits and alternatives for the proposed anesthesia with the patient or authorized representative who has indicated his/her understanding and acceptance.       Plan Discussed with: CRNA  Anesthesia Plan Comments:        Anesthesia Quick Evaluation

## 2022-08-07 NOTE — Interval H&P Note (Signed)
History and Physical Interval Note:  08/07/2022 1:19 PM  Sheila Velez  has presented today for surgery, with the diagnosis of dysphagia, hepatomegaly.  The various methods of treatment have been discussed with the patient and family. After consideration of risks, benefits and other options for treatment, the patient has consented to  Procedure(s) with comments: ESOPHAGOGASTRODUODENOSCOPY (EGD) WITH PROPOFOL (N/A) - 12:15 pm MALONEY DILATION (N/A) as a surgical intervention.  The patient's history has been reviewed, patient examined, no change in status, stable for surgery.  I have reviewed the patient's chart and labs.  Questions were answered to the patient's satisfaction.     Manus Rudd

## 2022-08-07 NOTE — Transfer of Care (Signed)
Immediate Anesthesia Transfer of Care Note  Patient: Sheila Velez  Procedure(s) Performed: ESOPHAGOGASTRODUODENOSCOPY (EGD) WITH PROPOFOL [name]  Patient Location: PACU  Anesthesia Type:General  Level of Consciousness: awake, alert , and oriented  Airway & Oxygen Therapy: Patient Spontanous Breathing  Post-op Assessment: Report given to RN, Post -op Vital signs reviewed and stable, Patient moving all extremities X 4, and Patient able to stick tongue midline  Post vital signs: Reviewed  Last Vitals:  Vitals Value Taken Time  BP 121/68 08/07/22 1333  Temp 36.5 C 08/07/22 1333  Pulse 83 08/07/22 1333  Resp 21 08/07/22 1333  SpO2 94 % 08/07/22 1333    Last Pain:  Vitals:   08/07/22 1333  TempSrc: Axillary  PainSc: 0-No pain      Patients Stated Pain Goal: 8 (25/00/37 0488)  Complications: No notable events documented.

## 2022-08-07 NOTE — Op Note (Signed)
[facility] [place] Patient Name: Sheila Velez Procedure Date: 08/07/2022 12:20 PM MRN: [id number] Date of Birth: 1950-11-20 Attending MD: Norvel Richards , MD, [id number] CSN: [id number] Age: 72 Admit Type: Outpatient Procedure:                Upper GI endoscopy Indications:              Dysphagia Providers:                Norvel Richards, MD, Crystal Page, Thomas Hoff., Technician Referring MD:              Medicines:                Propofol per Anesthesia Complications:            No immediate complications. Estimated Blood Loss:     Estimated blood loss was minimal. Procedure:                Pre-Anesthesia Assessment:                           - Prior to the procedure, a History and Physical                            was performed, and patient medications and                            allergies were reviewed. The patient's tolerance of                            previous anesthesia was also reviewed. The risks                            and benefits of the procedure and the sedation                            options and risks were discussed with the patient.                            All questions were answered, and informed consent                            was obtained. Prior Anticoagulants: The patient has                            taken no anticoagulant or antiplatelet agents. ASA                            Grade Assessment: II - A patient with mild systemic                            disease. After reviewing the risks and benefits,  the patient was deemed in satisfactory condition to                            undergo the procedure.                           After obtaining informed consent, the endoscope was                            passed under direct vision. Throughout the                            procedure, the patient's blood pressure, pulse, and                            oxygen saturations  were monitored continuously. The                            GIF-H190 ([id number]) scope was introduced through the                            mouth, and advanced to the second part of duodenum.                            The upper GI endoscopy was accomplished without                            difficulty. The patient tolerated the procedure                            well. The patient tolerated the procedure well. Scope In: 1:22:15 PM Scope Out: 1:29:47 PM Total Procedure Duration: 0 hours 7 minutes 32 seconds  Findings:      A moderate Schatzki ring was found at the gastroesophageal junction.       Esophageal mucosa otherwise appeared normal. Gastric cavity empty.      A large hiatal hernia was present. Normal gastric mucosa. Patent pylorus.      The duodenal bulb and second portion of the duodenum were normal. The       scope was withdrawn. Dilation was performed with a Maloney dilator with       mild resistance at 54 Fr. The dilation site was examined following       endoscope reinsertion and showed moderate improvement in luminal       narrowing. Estimated blood loss was minimal. Impression:               - Moderate Schatzki ring. Dilated.                           - Large hiatal hernia.                           - Normal duodenal bulb and second portion of the                            duodenum.                           -  No specimens collected. Moderate Sedation:      Moderate (conscious) sedation was personally administered by an       anesthesia professional. The following parameters were monitored: oxygen       saturation, heart rate, blood pressure, respiratory rate, EKG, adequacy       of pulmonary ventilation, and response to care. Recommendation:           - Patient has a contact number available for                            emergencies. The signs and symptoms of potential                            delayed complications were discussed with the                             patient. Return to normal activities tomorrow.                            Written discharge instructions were provided to the                            patient.                           - Advance diet as tolerated.                           - Continue present medications.                           - Return to my office in 1 year. Procedure Code(s):        --- Professional ---                           [phone], Esophagogastroduodenoscopy, flexible,                            transoral; diagnostic, including collection of                            specimen(s) by brushing or washing, when performed                            (separate procedure)                           43450, Dilation of esophagus, by unguided sound or                            bougie, single or multiple passes Diagnosis Code(s):        --- Professional ---                           K22.2, Esophageal obstruction  K44.9, Diaphragmatic hernia without obstruction or                            gangrene                           R13.10, Dysphagia, unspecified CPT copyright 2022 American [employer] Association. All rights reserved. The codes documented in this report are preliminary and upon coder review may  be revised to meet current compliance requirements. Gerrit Friends. Clarke Amburn, MD Gennette Pac, MD 08/07/2022 1:45:17 PM This report has been signed electronically. Number of Addenda: 0

## 2022-08-07 NOTE — Interval H&P Note (Signed)
History and Physical Interval Note:  08/07/2022 1:05 PM  Rex Kras  has presented today for surgery, with the diagnosis of dysphagia, hepatomegaly.  The various methods of treatment have been discussed with the patient and family. After consideration of risks, benefits and other options for treatment, the patient has consented to  Procedure(s) with comments: ESOPHAGOGASTRODUODENOSCOPY (EGD) WITH PROPOFOL (N/A) - 12:15 pm MALONEY DILATION (N/A) as a surgical intervention.  The patient's history has been reviewed, patient examined, no change in status, stable for surgery.  I have reviewed the patient's chart and labs.  Questions were answered to the patient's satisfaction.     Manus Rudd      patient without change.  Agree with need for EGD with possible esophageal dilation as feasible/appropriate per plan. The risks, benefits, limitations, alternatives and imponderables have been reviewed with the patient. Potential for esophageal dilation, biopsy, etc. have also been reviewed.  Questions have been answered. All parties agreeable.

## 2022-08-07 NOTE — Anesthesia Postprocedure Evaluation (Signed)
Anesthesia Post Note  Patient: Sheila Velez  Procedure(s) Performed: ESOPHAGOGASTRODUODENOSCOPY (EGD) WITH PROPOFOL [place]  Patient location during evaluation: Endoscopy Anesthesia Type: General Level of consciousness: awake and alert Pain management: pain level controlled Vital Signs Assessment: post-procedure vital signs reviewed and stable Respiratory status: spontaneous breathing, nonlabored ventilation, respiratory function stable and patient connected to nasal cannula oxygen Cardiovascular status: blood pressure returned to baseline and stable Postop Assessment: no apparent nausea or vomiting Anesthetic complications: no   There were no known notable events for this encounter.   Last Vitals:  Vitals:   08/07/22 1053 08/07/22 1333  BP: 116/62 121/68  Pulse: 79 83  Resp: (!) 22 (!) 21  Temp: 36.8 C 36.5 C  SpO2: 95% 94%    Last Pain:  Vitals:   08/07/22 1333  TempSrc: Axillary  PainSc: 0-No pain                 [name]

## 2022-08-07 NOTE — Discharge Instructions (Addendum)
EGD Discharge instructions Please read the instructions outlined below and refer to this sheet in the next few weeks. These discharge instructions provide you with general information on caring for yourself after you leave the hospital. Your doctor may also give you specific instructions. While your treatment has been planned according to the most current medical practices available, unavoidable complications occasionally occur. If you have any problems or questions after discharge, please call your doctor. ACTIVITY You may resume your regular activity but move at a slower pace for the next 24 hours.  Take frequent rest periods for the next 24 hours.  Walking will help expel (get rid of) the air and reduce the bloated feeling in your abdomen.  No driving for 24 hours (because of the anesthesia (medicine) used during the test).  You may shower.  Do not sign any important legal documents or operate any machinery for 24 hours (because of the anesthesia used during the test).  NUTRITION Drink plenty of fluids.  You may resume your normal diet.  Begin with a light meal and progress to your normal diet.  Avoid alcoholic beverages for 24 hours or as instructed by your caregiver.  MEDICATIONS You may resume your normal medications unless your caregiver tells you otherwise.  WHAT YOU CAN EXPECT TODAY You may experience abdominal discomfort such as a feeling of fullness or "gas" pains.  FOLLOW-UP Your doctor will discuss the results of your test with you.  SEEK IMMEDIATE MEDICAL ATTENTION IF ANY OF THE FOLLOWING OCCUR: Excessive nausea (feeling sick to your stomach) and/or vomiting.  Severe abdominal pain and distention (swelling).  Trouble swallowing.  Temperature over 101 F (37.8 C).  Rectal bleeding or vomiting of blood.       You have a Schatzki's ring.  These go with hiatal hernias.  You have a large hiatal hernia.   These rings form related to acid reflux.  The ring was dilated  today.   Continue Protonix 40 mg daily 30 minutes before breakfast    Office visit with [place] in one 1 year.    At patient request, I called Elina Streng at [phone] reviewed findings and recommendations

## 2022-08-12 ENCOUNTER — Encounter (HOSPITAL_COMMUNITY): Payer: Self-pay | Admitting: Internal Medicine

## 2022-08-13 ENCOUNTER — Telehealth: Payer: Self-pay | Admitting: *Deleted

## 2022-08-13 NOTE — Telephone Encounter (Signed)
Prescription left at front desk

## 2022-09-01 ENCOUNTER — Encounter: Payer: Self-pay | Admitting: Gastroenterology

## 2022-09-01 ENCOUNTER — Ambulatory Visit (INDEPENDENT_AMBULATORY_CARE_PROVIDER_SITE_OTHER): Payer: Medicare PPO | Admitting: Gastroenterology

## 2022-09-01 VITALS — BP 105/68 | HR 84 | Temp 97.6°F | Ht 60.0 in | Wt 196.0 lb

## 2022-09-01 DIAGNOSIS — R160 Hepatomegaly, not elsewhere classified: Secondary | ICD-10-CM

## 2022-09-01 DIAGNOSIS — K219 Gastro-esophageal reflux disease without esophagitis: Secondary | ICD-10-CM

## 2022-09-01 DIAGNOSIS — K7460 Unspecified cirrhosis of liver: Secondary | ICD-10-CM | POA: Diagnosis not present

## 2022-09-01 DIAGNOSIS — R1310 Dysphagia, unspecified: Secondary | ICD-10-CM

## 2022-09-01 NOTE — Progress Notes (Unsigned)
GI Office Note    Referring Provider: Valentino Nose, FNP Primary Care Physician:  Valentino Nose, [place] Primary Gastroenterologist: Cristopher Estimable.Rourk, MD  Date:  09/01/2022  ID:  Sheila Velez, DOB 11/05/1950, MRN [id number]   Chief Complaint   Chief Complaint  Patient presents with   Follow-up    Doing well.     History of Present Illness  Sheila Velez is a 72 y.o. female with a history of CKD, diabetes, asthma, GERD, HTN, RA, thrombocytopenia*** presenting today for follow up.  EGD 03/18/18: -normal duodenum -mild to moderate erythema of antrum and body consistent with gastritis -7cm hiatal hernia -esophagus normal s/p biopsy -stomach biopsy with mild chronic gastritis, negative H. Pylori -no pathologic change of esophageal biopsy. -Pantoprazole mg daily advised.     KUB 09/25/20 impression: -abnormal lucency overly right lower abdomen may reflect artifact. Consider supine and upright films to further assess.    MRCP 02/08/18 impression: -dilated CBD secondary to filling defects with small gallstones -splenomegaly   RUQ [place] July 2019 impression: -hepatomegaly with findings suggestive of cirrhosis and hepatic steatosis -cholelithiasis with dilated CBD.   CT chest w/o contrast Impression 04/11/22: -Mild bronchiectasis -No pulmonary nodules -Enlargement of caudate liver and nodular contour of liver suggesting cirrhosis -Splenomegaly -Large hiatal hernia.   Labs 04/02/22: Hgb 13.2, plts 139, AST 35, ALT 17, bilirubin 1.2, albumin 3.8, Cr 0.9, eGFR 68, BNP 48   Labs 04/16/22: Normal LFTs, plts 100   PCP visit note 04/22/22 reviewed. Patient requested PT referral for arthritis. Noted to be morbidly obese. Follows with rheumatology at [facility] for RA. Reported seasonal allergies and seen by ENT who ordered CT scan and hepatomegaly was noted. Report/scan not available to PCP. Noted history of thrombocytopenia and given report of hepatomegaly was referred to GI for further evaluation.    Last office visit 07/17/22.  Presented for evaluation of hepatomegaly.  Reported findings of possible cirrhosis on CT done in [place] for chronic cough.  Denies any abdominal pain, jaundice, dark urine, melena, PPI, Take meds Hatti, fatigue, weight gain.  Lost 30 pounds intentionally over the last year with regular exercise.  Did report some issues with dysphagia and getting choked on baked chicken and breads.  Reported previous EGD with dilation all done in [place].  Requested prior records.  Obtain acute hepatitis panel and repeat HFP.  Follow cirrhosis diet.  Proceed with EGD and possible dilation for dysphagia.  Advised to follow-up 2 months post procedure.  Labs 08/06/2022: Alk phos 109, AST 39, ALT 20, albumin 3.4.  GGT 40.  Creatinine 0.87.  Hepatitis panel negative.  Nonimmune to hepatitis B  EGD 08/07/2022: -Moderate Schatzki's ring s/p dilation -Large hiatal hernia   Today: Reports in 2019 that's about the times her low platelets started. As been on her [place] for many years - prior to 2019.   Swallowing is much improved. Next day it was a significant difference. Pantoprazole 40 mg once daily.   Reports she will be due for colonoscopy mid 2025. Has had 2 prior. No history of polyps.   Had ablations in her lower extremities in the past in order to try to increase the blood flow and decrease swelling.   Left leg chornically swollen, some sweling to RLE as well. Sometimes also has hand swelling.   No GI complaints.    Current Outpatient Medications  Medication Sig Dispense Refill   albuterol (VENTOLIN HFA) 108 (90 Base) MCG/ACT inhaler Inhale 2 puffs into the lungs every 4 (four) hours  as needed for wheezing or shortness of breath.     augmented betamethasone dipropionate (DIPROLENE-AF) 0.05 % ointment Apply 1 Application topically 2 (two) times daily.     azelastine (ASTELIN) 0.1 % nasal spray Place 2 sprays into both nostrils 2 (two) times daily. Use in each nostril as directed      Krill Oil 1000 MG CAPS Take 1,000 mg by mouth daily.     leflunomide (ARAVA) 20 MG tablet Take 20 mg by mouth daily.     montelukast (SINGULAIR) 10 MG tablet Take 10 mg by mouth daily.     pantoprazole (PROTONIX) 40 MG tablet Take 40 mg by mouth daily.     sodium chloride (OCEAN) 0.65 % nasal spray Place 1 spray into the nose daily.     TRELEGY ELLIPTA 200-62.5-25 MCG/ACT AEPB Inhale 1 puff into the lungs daily.     valsartan-hydrochlorothiazide (DIOVAN-HCT) 160-12.5 MG tablet Take 1 tablet by mouth daily.     No current facility-administered medications for this visit.    Past Medical History:  Diagnosis Date   Rheumatoid arthritis ([place])     Past Surgical History:  Procedure Laterality Date   CATARACT EXTRACTION     COLONOSCOPY     ESOPHAGOGASTRODUODENOSCOPY     ESOPHAGOGASTRODUODENOSCOPY (EGD) WITH PROPOFOL N/A 08/07/2022   Procedure: ESOPHAGOGASTRODUODENOSCOPY (EGD) WITH PROPOFOL;  Surgeon: Daneil Dolin, MD;  [facility];  Service: Endoscopy;  Laterality: N/A;  12:15 pm   MALONEY DILATION N/A 08/07/2022   Procedure: Venia Minks DILATION;  Surgeon: Daneil Dolin, MD;  Location: [facility];  Service: Endoscopy;  Laterality: N/A;    No family history on file.  Allergies as of 09/01/2022 - Review Complete 09/01/2022  Allergen Reaction Noted   Sulfamethoxazole-trimethoprim Nausea And Vomiting 06/30/2018    Social History   Socioeconomic History   Marital status: Married    Spouse name: Not on file   Number of children: Not on file   Years of education: Not on file   Highest education level: Not on file  Occupational History   Not on file  Tobacco Use   Smoking status: Never   Smokeless tobacco: Never  Vaping Use   Vaping Use: Never used  Substance and Sexual Activity   Alcohol use: Yes    Comment: rare   Drug use: Never   Sexual activity: Not Currently  Other Topics Concern   Not on file  Social History Narrative   Not on file   Social  Determinants of Health   Financial Resource Strain: Not on file  Food Insecurity: Not on file  Transportation Needs: Not on file  Physical Activity: Not on file  Stress: Not on file  Social Connections: Not on file     Review of Systems   Gen: Denies fever, chills, anorexia. Denies fatigue, weakness, weight loss.  CV: Denies chest pain, palpitations, syncope, peripheral edema, and claudication. Resp: Denies dyspnea at rest, cough, wheezing, coughing up blood, and pleurisy. GI: See HPI Derm: Denies rash, itching, dry skin Psych: Denies depression, anxiety, memory loss, confusion. No homicidal or suicidal ideation.  Heme: Denies bruising, bleeding, and enlarged lymph nodes.   Physical Exam   BP 105/68 (BP Location: Right Arm, Patient Position: Sitting, Cuff Size: Large)   Pulse 84   Temp 97.6 F (36.4 C) (Oral)   Ht 5' (1.524 m)   Wt 196 lb (88.9 kg)   BMI 38.28 kg/m   General:   Alert and oriented. No distress  noted. Pleasant and cooperative.  Head:  Normocephalic and atraumatic. Eyes:  Conjuctiva clear without scleral icterus. Mouth:  Oral mucosa pink and moist. Good dentition. No lesions. Lungs:  Clear to auscultation bilaterally. No wheezes, rales, or rhonchi. No distress.  Heart:  S1, S2 present without murmurs appreciated.  Abdomen:  +BS, soft, non-tender and non-distended. No rebound or guarding. No HSM or masses noted. Rectal:deferred Msk:  Symmetrical without gross deformities. Normal posture. Extremities:  nonpitting edema to BLE Neurologic:  Alert and  oriented x4 Psych:  Alert and cooperative. Normal mood and affect.   Assessment  Sheila Velez is a 73 y.o. female with a history of CKD, diabetes, asthma, GERD, HTN, RA, thrombocytopenia*** presenting today for follow up.  Possible Cirrhosis, hepatomegaly: ***  Dysphagia, GERD: EGD 07/28/22 with moderate Schatzki ring s/p dilation and large hiatal hernia. GERD controlled on pantoprazole 40 mg once daily.  Dysphagia resolved post dilation.   PLAN   *** RUQ [place] with elastography in March. CBC, CMP, INR in 6 months Continue pantoprazole 40 mg once daily.  High-protein diet from a primarily plant-based diet. Avoid red meat.  No raw or undercooked meat, seafood, or shellfish. Low-fat/cholesterol/carbohydrate diet. Limit sodium to no more than 2000 mg/day including everything that you eat and drink. Recommend at least 30 minutes of aerobic and resistance exercise 3 days/week. Follow up in 6 months, sooner if needed    Venetia Night, MSN, FNP-BC, AGACNP-BC [facility]

## 2022-09-01 NOTE — Patient Instructions (Addendum)
I am providing you some education on cirrhosis to look over. We will continue frequent monitoring for now until we confirm and will decide need for further lab evaluation after ultrasound.   Nutrition:  High-protein diet from a primarily plant-based diet. Avoid red meat.  No raw or undercooked meat, seafood, or shellfish. Low-fat/cholesterol/carbohydrate diet. Limit sodium to no more than 2000 mg/day including everything that you eat and drink. Recommend at least 30 minutes of aerobic and resistance exercise 3 days/week. Avoid sweets, sodas, fruit juices, sweetened beverages like tea, etc. Limit alcohol use.  Continue pantoprazole 40 mg once daily.   We are scheduling you for an ultrasound with elastography in the near future. Our scheduler will be in contact with you.  We will plan to repeat your labs in 6 months.   If you develop any confusion, worsening lower extremity edema, chest pain, shortness of breath, swelling to your abdomen, and or bleeding black stools/bright red blood in stools then please call the office.   It was a pleasure to see you today. I want to create trusting relationships with patients. If you receive a survey regarding your visit,  I greatly appreciate you taking time to fill this out on paper or through your MyChart. I value your feedback.  Venetia Night, MSN, FNP-BC, AGACNP-BC [facility]

## 2022-09-03 ENCOUNTER — Telehealth: Payer: Self-pay | Admitting: *Deleted

## 2022-09-03 NOTE — Telephone Encounter (Signed)
Called pt and she is aware of [place] appt details. She voiced understanding.

## 2022-09-29 ENCOUNTER — Ambulatory Visit (HOSPITAL_COMMUNITY)
Admission: RE | Admit: 2022-09-29 | Discharge: 2022-09-29 | Disposition: A | Discharge status: Home / Self Care | Payer: Medicare PPO | Source: Ambulatory Visit | Attending: Gastroenterology | Admitting: Gastroenterology

## 2022-09-29 DIAGNOSIS — K8020 Calculus of gallbladder without cholecystitis without obstruction: Secondary | ICD-10-CM | POA: Diagnosis not present

## 2022-09-29 DIAGNOSIS — R160 Hepatomegaly, not elsewhere classified: Secondary | ICD-10-CM | POA: Diagnosis not present

## 2022-09-29 DIAGNOSIS — K7460 Unspecified cirrhosis of liver: Secondary | ICD-10-CM | POA: Diagnosis not present

## 2022-10-16 DIAGNOSIS — N182 Chronic kidney disease, stage 2 (mild): Secondary | ICD-10-CM | POA: Diagnosis not present

## 2022-10-16 DIAGNOSIS — Z139 Encounter for screening, unspecified: Secondary | ICD-10-CM | POA: Diagnosis not present

## 2022-10-21 DIAGNOSIS — Z79899 Other long term (current) drug therapy: Secondary | ICD-10-CM | POA: Diagnosis not present

## 2022-10-21 DIAGNOSIS — M059 Rheumatoid arthritis with rheumatoid factor, unspecified: Secondary | ICD-10-CM | POA: Diagnosis not present

## 2022-10-21 DIAGNOSIS — M3500 Sicca syndrome, unspecified: Secondary | ICD-10-CM | POA: Diagnosis not present

## 2022-10-22 ENCOUNTER — Other Ambulatory Visit: Payer: Self-pay | Admitting: *Deleted

## 2022-10-22 DIAGNOSIS — R7989 Other specified abnormal findings of blood chemistry: Secondary | ICD-10-CM

## 2022-11-03 DIAGNOSIS — Z0000 Encounter for general adult medical examination without abnormal findings: Secondary | ICD-10-CM | POA: Diagnosis not present

## 2022-11-04 DIAGNOSIS — Z6834 Body mass index (BMI) 34.0-34.9, adult: Secondary | ICD-10-CM | POA: Diagnosis not present

## 2022-11-04 DIAGNOSIS — E669 Obesity, unspecified: Secondary | ICD-10-CM | POA: Diagnosis not present

## 2022-11-04 DIAGNOSIS — R7989 Other specified abnormal findings of blood chemistry: Secondary | ICD-10-CM | POA: Diagnosis not present

## 2022-11-04 DIAGNOSIS — J302 Other seasonal allergic rhinitis: Secondary | ICD-10-CM | POA: Diagnosis not present

## 2022-11-04 DIAGNOSIS — D696 Thrombocytopenia, unspecified: Secondary | ICD-10-CM | POA: Diagnosis not present

## 2022-11-04 DIAGNOSIS — N189 Chronic kidney disease, unspecified: Secondary | ICD-10-CM | POA: Diagnosis not present

## 2022-11-04 DIAGNOSIS — M069 Rheumatoid arthritis, unspecified: Secondary | ICD-10-CM | POA: Diagnosis not present

## 2022-11-04 DIAGNOSIS — Z713 Dietary counseling and surveillance: Secondary | ICD-10-CM | POA: Diagnosis not present

## 2022-11-04 DIAGNOSIS — I129 Hypertensive chronic kidney disease with stage 1 through stage 4 chronic kidney disease, or unspecified chronic kidney disease: Secondary | ICD-10-CM | POA: Diagnosis not present

## 2022-11-04 DIAGNOSIS — R160 Hepatomegaly, not elsewhere classified: Secondary | ICD-10-CM | POA: Diagnosis not present

## 2022-11-05 LAB — HEPATIC FUNCTION PANEL
ALT: 23 IU/L (ref 0–32)
AST: 41 IU/L — ABNORMAL HIGH (ref 0–40)
Albumin: 3.9 g/dL (ref 3.8–4.8)
Alkaline Phosphatase: 109 IU/L (ref 44–121)
Bilirubin Total: 1.2 mg/dL (ref 0.0–1.2)
Bilirubin, Direct: 0.4 mg/dL (ref 0.00–0.40)
Total Protein: 6.1 g/dL (ref 6.0–8.5)

## 2022-11-05 LAB — GAMMA GT: GGT: 46 IU/L (ref 0–60)

## 2022-11-24 DIAGNOSIS — Z1231 Encounter for screening mammogram for malignant neoplasm of breast: Secondary | ICD-10-CM | POA: Diagnosis not present

## 2022-12-01 DIAGNOSIS — L03119 Cellulitis of unspecified part of limb: Secondary | ICD-10-CM | POA: Diagnosis not present

## 2022-12-01 DIAGNOSIS — R21 Rash and other nonspecific skin eruption: Secondary | ICD-10-CM | POA: Diagnosis not present

## 2022-12-01 DIAGNOSIS — J4550 Severe persistent asthma, uncomplicated: Secondary | ICD-10-CM | POA: Diagnosis not present

## 2022-12-01 DIAGNOSIS — Z23 Encounter for immunization: Secondary | ICD-10-CM | POA: Diagnosis not present

## 2022-12-01 DIAGNOSIS — L03116 Cellulitis of left lower limb: Secondary | ICD-10-CM | POA: Diagnosis not present

## 2022-12-08 DIAGNOSIS — H52223 Regular astigmatism, bilateral: Secondary | ICD-10-CM | POA: Diagnosis not present

## 2022-12-08 DIAGNOSIS — H04123 Dry eye syndrome of bilateral lacrimal glands: Secondary | ICD-10-CM | POA: Diagnosis not present

## 2022-12-08 DIAGNOSIS — H353131 Nonexudative age-related macular degeneration, bilateral, early dry stage: Secondary | ICD-10-CM | POA: Diagnosis not present

## 2022-12-09 DIAGNOSIS — I872 Venous insufficiency (chronic) (peripheral): Secondary | ICD-10-CM | POA: Diagnosis not present

## 2022-12-09 DIAGNOSIS — R197 Diarrhea, unspecified: Secondary | ICD-10-CM | POA: Diagnosis not present

## 2022-12-22 DIAGNOSIS — I872 Venous insufficiency (chronic) (peripheral): Secondary | ICD-10-CM | POA: Diagnosis not present

## 2022-12-22 DIAGNOSIS — L570 Actinic keratosis: Secondary | ICD-10-CM | POA: Diagnosis not present

## 2022-12-22 DIAGNOSIS — R600 Localized edema: Secondary | ICD-10-CM | POA: Diagnosis not present

## 2022-12-22 DIAGNOSIS — S80812A Abrasion, left lower leg, initial encounter: Secondary | ICD-10-CM | POA: Diagnosis not present

## 2023-01-26 DIAGNOSIS — I788 Other diseases of capillaries: Secondary | ICD-10-CM | POA: Diagnosis not present

## 2023-01-26 DIAGNOSIS — L570 Actinic keratosis: Secondary | ICD-10-CM | POA: Diagnosis not present

## 2023-01-26 DIAGNOSIS — Z1283 Encounter for screening for malignant neoplasm of skin: Secondary | ICD-10-CM | POA: Diagnosis not present

## 2023-01-26 DIAGNOSIS — R600 Localized edema: Secondary | ICD-10-CM | POA: Diagnosis not present

## 2023-01-26 DIAGNOSIS — L821 Other seborrheic keratosis: Secondary | ICD-10-CM | POA: Diagnosis not present

## 2023-01-26 DIAGNOSIS — L814 Other melanin hyperpigmentation: Secondary | ICD-10-CM | POA: Diagnosis not present

## 2023-01-26 DIAGNOSIS — D1801 Hemangioma of skin and subcutaneous tissue: Secondary | ICD-10-CM | POA: Diagnosis not present

## 2023-01-26 DIAGNOSIS — I872 Venous insufficiency (chronic) (peripheral): Secondary | ICD-10-CM | POA: Diagnosis not present

## 2023-02-19 ENCOUNTER — Other Ambulatory Visit: Payer: Self-pay | Admitting: *Deleted

## 2023-02-19 ENCOUNTER — Telehealth: Payer: Self-pay | Admitting: *Deleted

## 2023-02-19 DIAGNOSIS — R160 Hepatomegaly, not elsewhere classified: Secondary | ICD-10-CM

## 2023-02-19 DIAGNOSIS — K7460 Unspecified cirrhosis of liver: Secondary | ICD-10-CM

## 2023-02-19 NOTE — Telephone Encounter (Signed)
Mailed lab requisitions, to have completed next week.

## 2023-03-02 ENCOUNTER — Ambulatory Visit: Payer: Medicare PPO | Admitting: Gastroenterology

## 2023-03-03 ENCOUNTER — Ambulatory Visit: Payer: Medicare PPO | Admitting: Gastroenterology

## 2023-03-26 DIAGNOSIS — R0989 Other specified symptoms and signs involving the circulatory and respiratory systems: Secondary | ICD-10-CM | POA: Diagnosis not present

## 2023-03-26 DIAGNOSIS — J22 Unspecified acute lower respiratory infection: Secondary | ICD-10-CM | POA: Diagnosis not present

## 2023-04-07 DIAGNOSIS — J189 Pneumonia, unspecified organism: Secondary | ICD-10-CM | POA: Diagnosis not present

## 2023-04-07 DIAGNOSIS — R197 Diarrhea, unspecified: Secondary | ICD-10-CM | POA: Diagnosis not present

## 2023-04-28 DIAGNOSIS — M3500 Sicca syndrome, unspecified: Secondary | ICD-10-CM | POA: Diagnosis not present

## 2023-04-28 DIAGNOSIS — M19042 Primary osteoarthritis, left hand: Secondary | ICD-10-CM | POA: Diagnosis not present

## 2023-04-28 DIAGNOSIS — M19041 Primary osteoarthritis, right hand: Secondary | ICD-10-CM | POA: Diagnosis not present

## 2023-04-28 DIAGNOSIS — R600 Localized edema: Secondary | ICD-10-CM | POA: Diagnosis not present

## 2023-04-28 DIAGNOSIS — Z7969 Long term (current) use of other immunomodulators and immunosuppressants: Secondary | ICD-10-CM | POA: Diagnosis not present

## 2023-04-28 DIAGNOSIS — Z23 Encounter for immunization: Secondary | ICD-10-CM | POA: Diagnosis not present

## 2023-04-28 DIAGNOSIS — M059 Rheumatoid arthritis with rheumatoid factor, unspecified: Secondary | ICD-10-CM | POA: Diagnosis not present

## 2023-04-28 DIAGNOSIS — M170 Bilateral primary osteoarthritis of knee: Secondary | ICD-10-CM | POA: Diagnosis not present

## 2023-04-28 DIAGNOSIS — Z79899 Other long term (current) drug therapy: Secondary | ICD-10-CM | POA: Diagnosis not present

## 2023-04-29 DIAGNOSIS — M7989 Other specified soft tissue disorders: Secondary | ICD-10-CM | POA: Diagnosis not present

## 2023-04-30 DIAGNOSIS — M7989 Other specified soft tissue disorders: Secondary | ICD-10-CM | POA: Diagnosis not present

## 2023-05-06 DIAGNOSIS — R160 Hepatomegaly, not elsewhere classified: Secondary | ICD-10-CM | POA: Diagnosis not present

## 2023-05-06 DIAGNOSIS — I129 Hypertensive chronic kidney disease with stage 1 through stage 4 chronic kidney disease, or unspecified chronic kidney disease: Secondary | ICD-10-CM | POA: Diagnosis not present

## 2023-05-06 DIAGNOSIS — N189 Chronic kidney disease, unspecified: Secondary | ICD-10-CM | POA: Diagnosis not present

## 2023-05-06 DIAGNOSIS — D696 Thrombocytopenia, unspecified: Secondary | ICD-10-CM | POA: Diagnosis not present

## 2023-05-06 DIAGNOSIS — J302 Other seasonal allergic rhinitis: Secondary | ICD-10-CM | POA: Diagnosis not present

## 2023-05-06 DIAGNOSIS — E669 Obesity, unspecified: Secondary | ICD-10-CM | POA: Diagnosis not present

## 2023-05-06 DIAGNOSIS — M069 Rheumatoid arthritis, unspecified: Secondary | ICD-10-CM | POA: Diagnosis not present

## 2023-05-06 DIAGNOSIS — R809 Proteinuria, unspecified: Secondary | ICD-10-CM | POA: Diagnosis not present

## 2023-05-20 DIAGNOSIS — R0781 Pleurodynia: Secondary | ICD-10-CM | POA: Diagnosis not present

## 2023-05-20 DIAGNOSIS — S2232XA Fracture of one rib, left side, initial encounter for closed fracture: Secondary | ICD-10-CM | POA: Diagnosis not present

## 2023-05-28 DIAGNOSIS — R110 Nausea: Secondary | ICD-10-CM | POA: Diagnosis not present

## 2023-05-28 DIAGNOSIS — Z20822 Contact with and (suspected) exposure to covid-19: Secondary | ICD-10-CM | POA: Diagnosis not present

## 2023-07-10 ENCOUNTER — Other Ambulatory Visit (HOSPITAL_COMMUNITY): Payer: Self-pay | Admitting: Internal Medicine

## 2023-07-10 DIAGNOSIS — Z13820 Encounter for screening for osteoporosis: Secondary | ICD-10-CM

## 2023-07-23 DIAGNOSIS — J4521 Mild intermittent asthma with (acute) exacerbation: Secondary | ICD-10-CM | POA: Diagnosis not present

## 2023-07-24 DIAGNOSIS — J4531 Mild persistent asthma with (acute) exacerbation: Secondary | ICD-10-CM | POA: Diagnosis not present

## 2023-07-31 ENCOUNTER — Encounter: Payer: Self-pay | Admitting: Internal Medicine

## 2023-08-05 DIAGNOSIS — R0600 Dyspnea, unspecified: Secondary | ICD-10-CM | POA: Diagnosis not present

## 2023-08-05 DIAGNOSIS — I5041 Acute combined systolic (congestive) and diastolic (congestive) heart failure: Secondary | ICD-10-CM | POA: Diagnosis not present

## 2023-08-05 DIAGNOSIS — R6521 Severe sepsis with septic shock: Secondary | ICD-10-CM | POA: Diagnosis not present

## 2023-08-05 DIAGNOSIS — I5033 Acute on chronic diastolic (congestive) heart failure: Secondary | ICD-10-CM | POA: Diagnosis not present

## 2023-08-05 DIAGNOSIS — I350 Nonrheumatic aortic (valve) stenosis: Secondary | ICD-10-CM | POA: Diagnosis not present

## 2023-08-05 DIAGNOSIS — I509 Heart failure, unspecified: Secondary | ICD-10-CM | POA: Diagnosis not present

## 2023-08-05 DIAGNOSIS — J4550 Severe persistent asthma, uncomplicated: Secondary | ICD-10-CM | POA: Diagnosis not present

## 2023-08-05 DIAGNOSIS — J189 Pneumonia, unspecified organism: Secondary | ICD-10-CM | POA: Diagnosis not present

## 2023-08-05 DIAGNOSIS — A419 Sepsis, unspecified organism: Secondary | ICD-10-CM | POA: Diagnosis not present

## 2023-08-05 DIAGNOSIS — R0602 Shortness of breath: Secondary | ICD-10-CM | POA: Diagnosis not present

## 2023-08-05 DIAGNOSIS — Y999 Unspecified external cause status: Secondary | ICD-10-CM | POA: Diagnosis not present

## 2023-08-05 DIAGNOSIS — J45909 Unspecified asthma, uncomplicated: Secondary | ICD-10-CM | POA: Diagnosis not present

## 2023-08-05 DIAGNOSIS — R600 Localized edema: Secondary | ICD-10-CM | POA: Diagnosis not present

## 2023-08-05 DIAGNOSIS — I959 Hypotension, unspecified: Secondary | ICD-10-CM | POA: Diagnosis not present

## 2023-08-05 DIAGNOSIS — S2249XA Multiple fractures of ribs, unspecified side, initial encounter for closed fracture: Secondary | ICD-10-CM | POA: Diagnosis not present

## 2023-08-05 DIAGNOSIS — J9811 Atelectasis: Secondary | ICD-10-CM | POA: Diagnosis not present

## 2023-08-05 DIAGNOSIS — J90 Pleural effusion, not elsewhere classified: Secondary | ICD-10-CM | POA: Diagnosis not present

## 2023-08-05 DIAGNOSIS — S2243XA Multiple fractures of ribs, bilateral, initial encounter for closed fracture: Secondary | ICD-10-CM | POA: Diagnosis not present

## 2023-08-05 DIAGNOSIS — Z20822 Contact with and (suspected) exposure to covid-19: Secondary | ICD-10-CM | POA: Diagnosis not present

## 2023-08-05 DIAGNOSIS — J22 Unspecified acute lower respiratory infection: Secondary | ICD-10-CM | POA: Diagnosis not present

## 2023-08-05 DIAGNOSIS — E871 Hypo-osmolality and hyponatremia: Secondary | ICD-10-CM | POA: Diagnosis not present

## 2023-08-05 DIAGNOSIS — E222 Syndrome of inappropriate secretion of antidiuretic hormone: Secondary | ICD-10-CM | POA: Diagnosis not present

## 2023-08-05 DIAGNOSIS — J180 Bronchopneumonia, unspecified organism: Secondary | ICD-10-CM | POA: Diagnosis not present

## 2023-08-05 DIAGNOSIS — J948 Other specified pleural conditions: Secondary | ICD-10-CM | POA: Diagnosis not present

## 2023-08-05 DIAGNOSIS — R1310 Dysphagia, unspecified: Secondary | ICD-10-CM | POA: Diagnosis not present

## 2023-08-05 DIAGNOSIS — N179 Acute kidney failure, unspecified: Secondary | ICD-10-CM | POA: Diagnosis not present

## 2023-08-05 DIAGNOSIS — J9601 Acute respiratory failure with hypoxia: Secondary | ICD-10-CM | POA: Diagnosis not present

## 2023-08-05 DIAGNOSIS — M3500 Sicca syndrome, unspecified: Secondary | ICD-10-CM | POA: Diagnosis not present

## 2023-08-22 DEATH — deceased
